# Patient Record
Sex: Female | Born: 1970
Health system: Southern US, Community
[De-identification: ages and names within clinical notes are randomized; demographics above are authoritative.]

---

## 2000-08-22 ENCOUNTER — Other Ambulatory Visit: Admission: RE | Admit: 2000-08-22 | Discharge: 2000-08-22 | Payer: Self-pay | Admitting: Obstetrics & Gynecology

## 2000-08-22 ENCOUNTER — Other Ambulatory Visit: Admission: RE | Admit: 2000-08-22 | Discharge: 2000-08-22 | Payer: Self-pay | Admitting: Family Medicine

## 2001-02-12 ENCOUNTER — Inpatient Hospital Stay (HOSPITAL_COMMUNITY): Admission: AD | Admit: 2001-02-12 | Discharge: 2001-02-12 | Payer: Self-pay | Admitting: Obstetrics & Gynecology

## 2001-02-21 ENCOUNTER — Inpatient Hospital Stay (HOSPITAL_COMMUNITY): Admission: AD | Admit: 2001-02-21 | Discharge: 2001-02-24 | Payer: Self-pay | Admitting: Obstetrics and Gynecology

## 2001-03-23 ENCOUNTER — Other Ambulatory Visit: Admission: RE | Admit: 2001-03-23 | Discharge: 2001-03-23 | Payer: Self-pay | Admitting: Obstetrics and Gynecology

## 2002-04-12 ENCOUNTER — Other Ambulatory Visit: Admission: RE | Admit: 2002-04-12 | Discharge: 2002-04-12 | Payer: Self-pay | Admitting: Obstetrics and Gynecology

## 2003-03-07 ENCOUNTER — Other Ambulatory Visit: Admission: RE | Admit: 2003-03-07 | Discharge: 2003-03-07 | Payer: Self-pay | Admitting: Obstetrics and Gynecology

## 2003-09-13 ENCOUNTER — Inpatient Hospital Stay (HOSPITAL_COMMUNITY): Admission: AD | Admit: 2003-09-13 | Discharge: 2003-09-13 | Payer: Self-pay | Admitting: Obstetrics and Gynecology

## 2003-09-23 ENCOUNTER — Inpatient Hospital Stay (HOSPITAL_COMMUNITY): Admission: RE | Admit: 2003-09-23 | Discharge: 2003-09-26 | Payer: Self-pay | Admitting: Obstetrics and Gynecology

## 2003-09-23 ENCOUNTER — Encounter (INDEPENDENT_AMBULATORY_CARE_PROVIDER_SITE_OTHER): Payer: Self-pay | Admitting: Specialist

## 2003-11-05 ENCOUNTER — Other Ambulatory Visit: Admission: RE | Admit: 2003-11-05 | Discharge: 2003-11-05 | Payer: Self-pay | Admitting: Obstetrics and Gynecology

## 2004-11-30 ENCOUNTER — Other Ambulatory Visit: Admission: RE | Admit: 2004-11-30 | Discharge: 2004-11-30 | Payer: Self-pay | Admitting: Obstetrics and Gynecology

## 2007-08-28 ENCOUNTER — Encounter: Admission: RE | Admit: 2007-08-28 | Discharge: 2007-08-28 | Payer: Self-pay | Admitting: Gastroenterology

## 2010-05-28 NOTE — H&P (Signed)
[facility]  Patient:    Andrea Franklin, Andrea Franklin Visit Number: [id number] MRN: [id number]          Service Type: Attending:  Duke Salvia. Marcelle Overlie, M.D. Dictated by:   Duke Salvia. Marcelle Overlie, M.D.                           History and Physical  CHIEF COMPLAINT:              Labor at term, breech presentation.  HISTORY OF PRESENT ILLNESS:   A 40 year old G2, P0-0-1-0, EDD is March 05, 2001.  She was scheduled for a primary cesarean section next week for persistent breech presentation.  She presents this evening in labor.  She has an uneventful pregnancy with a one-hour GTT of 99.  Blood type is AB negative.  Rubella titer was equivocal.  Group B strep culture was negative.  PAST MEDICAL HISTORY: ALLERGIES:                    None.  OPERATIONS:                   None.  OB/GYN HISTORY:               Spontaneous abortion in 2002.  PHYSICAL EXAMINATION:  VITAL SIGNS:                  Temperature 98.2, blood pressure 100/72.  HEENT:                        Unremarkable.  NECK:                         Supple without mass.  LUNGS:                        Clear.  CARDIOVASCULAR:               Regular rate and rhythm without murmurs, rubs, gallops.  BREASTS:                      Not examined.  ABDOMEN:                      Term fundal height.  Breech by Thayer Ohm.  Fetal heart rate 140.  PELVIC EXAM:                  The cervix was 1+, completely effaced, membranes intact.  Breech presentation.  EXTREMITIES/NEUROLOGICAL:     Exam unremarkable.  DIAGNOSTIC DATA:              Breech presentation verified by ultrasound.  IMPRESSION:                   Term intrauterine pregnancy, breech                               presentation, early labor.  PLAN:                         Primary cesarean section.  Procedure including risks of bleeding, infection, adjacent organ injury reviewed with her which she understands and accepts. Dictated by:   Duke Salvia. Marcelle Overlie,  M.D. Attending:  Duke Salvia. Marcelle Overlie, M.D. DD:  02/21/01 TD:  02/21/01 Job: [id number]

## 2010-05-28 NOTE — Discharge Summary (Signed)
NAME:  Andrea Franklin, Andrea Franklin             ACCOUNT NO.:  [id number]   MEDICAL RECORD NO.:  [id number]          PATIENT TYPE:  INP   LOCATION:  9144                          FACILITY:  [facility]   PHYSICIAN:  Michelle L. Grewal, M.D.DATE OF BIRTH:  07/05/1970   DATE OF ADMISSION:  09/23/2003  DATE OF DISCHARGE:  09/26/2003                                 DISCHARGE SUMMARY   ADMISSION DIAGNOSES:  1.  Intrauterine pregnancy at term.  2.  Previous cesarean section, refuses vaginal birth after cesarean section.  3.  Multiparity, desires permanent sterilization.   DISCHARGE DIAGNOSES:  1.  Status post low transverse cesarean section.  2.  Viable female infant.  3.  Permanent sterilization.   PROCEDURE:  1.  Repeat low transverse cesarean section.  2.  Bilateral tubal ligation.   REASON FOR ADMISSION:  Please see written H&P.   HOSPITAL COURSE:  The patient is a 40 year old gravida 3, para 1, that  presented to [facility] at term for a scheduled cesarean delivery.  The patient had a previous cesarean section and due to multiparity, she also  desired permanent sterilization.  On the morning of admission, the patient  was taken to the operating room where spinal anesthesia was administered  without difficulty.  A low transverse incision was made with the delivery of  a viable female infant weighing 7 pounds 10 ounces with Apgars of 9 at one  minute and 9 at five minutes.  Bilateral tubal ligation was performed  without difficulty.  The patient tolerated the procedure well and was taken  to the recovery room in stable condition.  On postoperative day #1, the  patient had a syncopal episode during the night, however, the following  morning vital signs were stable, and she was afebrile.  Blood pressure 84 to  106 over 40 to 69.  Abdomen was soft with good return of bowel function.  Fundus was firm and nontender.  Abdominal dressing was noted to have a small  amount of drainage noted on the bandage.   Lungs were clear to auscultation  and labs revealed hemoglobin of 12.3 which on admission was 12.8.  On  postoperative day #2, the patient was without complaint other than some  complaints of hemorrhoids.  Vital signs were stable.  She was afebrile.  Abdomen was soft.  Fundus was firm and nontender.  Abdominal dressing had  been removed revealing incision that was clean, dry, and intact.  ProctoFoam  was administered.  On postoperative day #3, vital signs were stable, she was  afebrile, fundus was firm and nontender.  Incision was clean, dry, and  intact.  Staples were removed and the patient was discharged home.   CONDITION ON DISCHARGE:  Good.   DIET:  As tolerated.   ACTIVITY:  No heavy lifting, no driving x2 weeks, and no vaginal entry.   FOLLOW UP:  The patient is to follow up in the office in one to two weeks  for an incision check.  She is to call for temperature greater than 100  degrees, persistent nausea and vomiting, heavy vaginal bleeding, and/or  redness  or drainage from the incision site.   DISCHARGE MEDICATIONS:  1.  Tylox #30 one p.o. every four to six hours p.r.n.  2.  Motrin 600 mg every six hours.  3.  ProctoFoam HC b.i.d.  4.  Colace one p.o. daily p.r.n.     Caro   CC/MEDQ  D:  10/24/2003  T:  10/25/2003  Job:  [id number]

## 2010-05-28 NOTE — Op Note (Signed)
[facility]  Patient:    Andrea Franklin, Andrea Franklin Visit Number: [id number] MRN: [id number]          Service Type: OBS Location: 910A 9114 [id number] Attending Physician:  Rhina Brackett Dictated by:   Duke Salvia. Marcelle Overlie, M.D. Proc. Date: 02/21/01 Admit Date:  02/21/2001                             Operative Report  PREOPERATIVE DIAGNOSIS:       Breech presentation at term, labor.  POSTOPERATIVE DIAGNOSIS:      Breech presentation at term, labor.  OPERATION:                    Primary low transverse cesarean section.  SURGEON:                      Duke Salvia. Marcelle Overlie, M.D.  ANESTHESIA:                   Spinal.  COMPLICATIONS:                None.  DRAINS:                       Foley catheter.  ESTIMATED BLOOD LOSS:         800.  DESCRIPTION OF PROCEDURE AND FINDINGS:                     The patient was taken to the operating room. After an adequate level of spinal anesthetic was obtained with the patient in the left tilt position, the abdomen was prepped and draped in the usual manner for sterile abdominal procedures.  A Foley catheter was positioned draining clear urine.  A transverse incision was made two fingerbreadths above the symphysis and carried down to the fascia which was incised and extended transversely.  The rectus muscles were divided in the midline and the peritoneum entered superiorly without incident and extended in a vertical manner.  The vesicouterine ________ was then incised, and the bladder was bluntly and sharply dissected off of the lower uterine segment.  A bladder blade was positioned.  A transverse incision was made in the lower segment and extended with bandage scissors.  Clear fluid then noted and the patient then delivered of a 7 pound and 14 ounce female.  Apgars 7 and 9 from the frank breech presentation without difficulty.  The infant was suctioned, cord clamped, and passed to the pediatric team for further care.   Placenta delivered manually intact.  The uterus exteriorized and the cavity wiped with the laparotomy pack.  Closure obtained with a first layer of 0 chromic in a locked fashion followed by an imbricating layer of 0 chromic.  With this hemostatic, the bladder flap area was inspected and noted to be intact and hemostatic.  The tubes and ovaries were normal.  Prior to closure, sponge, needle, and instrument counts were reported as correct x2.  The rectus muscles were reapproximated with 2-0 Dexon interrupted sutures.  The fascia was closed from laterally to midline inside with a 0 Dexon running suture.  Subcutaneous fat was hemostatic.  Clips and Steri-Strips were used on the skin, and she did receive Ancef 1 g IV after the cord was clamped along with Pitocin IV.  Clear urine was noted at the end of the case.  She went to  the recovery room in good condition. Dictated by:   Duke Salvia. Marcelle Overlie, M.D. Attending Physician:  Rhina Brackett DD:  02/21/01 TD:  02/22/01 Job: [id number]

## 2010-05-28 NOTE — Discharge Summary (Signed)
[facility]  Patient:    Andrea Franklin, Andrea Franklin Visit Number: [id number] MRN: [id number]          Service Type: OBS Location: 910A 9114 01 Attending Physician:  Rhina Brackett Dictated by:   Danie Chandler, R.N. Admit Date:  02/21/2001 Discharge Date: 02/24/2001                             Discharge Summary  ADMISSION DIAGNOSIS:  Intrauterine pregnancy at term with breech presentation in early labor.  DISCHARGE DIAGNOSIS:  Intrauterine pregnancy at term with breech presentation in early labor.  PROCEDURE:  On February 21, 2001, primary low transverse cesarean section.  REASON FOR ADMISSION:  Please see dictated H&P.  HOSPITAL COURSE:  The patient was taken to the operating room and underwent the above named procedure without complication.  This was productive of a viable female infant with Apgars of 7 at one minute and 9 at five minutes. Postoperatively, on day #1, the patient was without complaint.  Her hemoglobin was 12.2, hematocrit 34.7, and white blood cell count 13.4.  On postoperative day #2, the patient had a good return of bowel function, was tolerating a regular diet.  She was also ambulating well without difficulty, and had good pain control.  She was discharged home on postoperative day #3.  CONDITION ON DISCHARGE:  Good.  DIET:  Regular as tolerated.  ACTIVITY:  No heavy lifting, no driving, no vaginal entry.  FOLLOWUP:  She is to follow up in the office in 1 to 2 weeks for incision check.  She is to call for temperature greater than 100 degrees, persistent nausea or vomiting, heavy vaginal bleeding, and/or redness or drainage from the incision site.  DISCHARGE MEDICATIONS: 1. Prenatal vitamin one p.o. q.d. 2. Motrin 600 mg one p.o. q.6h. p.r.n. pain. 3. Tylox one or two p.o. q.4-6h. p.r.n. pain.Dictated by:   Danie Chandler, R.N. Attending Physician:  Rhina Brackett DD:  03/09/01 TD:  03/09/01 Job:  [id number]

## 2010-05-28 NOTE — Op Note (Signed)
NAME:  Andrea Franklin, Andrea Franklin                       ACCOUNT NO.:  [id number]   MEDICAL RECORD NO.:  [id number]                   PATIENT TYPE:  INP   LOCATION:  NA                                   FACILITY:  [facility]   PHYSICIAN:  Michelle L. Vincente Poli, M.D.            DATE OF BIRTH:  Dec 30, 1970   DATE OF PROCEDURE:  09/23/2003  DATE OF DISCHARGE:                                 OPERATIVE REPORT   PREOPERATIVE DIAGNOSES:  1.  Intrauterine pregnancy at term.  2.  Previous cesarean section, refuses vaginal birth after cesarean      delivery.  3.  Desires permanent sterilization.   POSTOPERATIVE DIAGNOSES:  1.  Intrauterine pregnancy at term.  2.  Previous cesarean section, refuses vaginal birth after cesarean      delivery.  3.  Desires permanent sterilization.   PROCEDURES:  1.  Repeat low transverse cesarean section.  2.  Bilateral tubal ligation.   SURGEON:  Michelle L. Vincente Poli, M.D.   ANESTHESIA:  Spinal.   FINDINGS:  A female infant, Apgars 9 at one minute and 9 at five minutes,  weighing 7 pounds 10 ounces.   PROCEDURE:  The patient was taken to the operating room.  She was then given  her spinal without incident.  She was then prepped and draped in the usual  sterile fashion and a Foley catheter was inserted into the bladder.  A low  transverse incision was made at the area of the previous cesarean section  scar, carried down to the fascia, the fascia scored in the midline and  extended laterally.  The rectus muscles were separated in the midline and  then the peritoneum was entered bluntly.  The peritoneal incision was then  stretched.  The bladder blade was inserted, the lower uterine segment was  identified, and the bladder flap was created sharply and then digitally and  the bladder blade was then readjusted.  A low transverse incision was made  in the uterus.  The amniotic fluid was clear.  The baby was delivered very  easily.  It was a female infant in cephalic presentation  with Apgars 9 at one  minute and 9 at five minutes, weighing 7 pounds 10 ounces.  The cord was  clamped and cut.  The baby was handed to the waiting pediatrician.  The cord  blood was obtained.  The placenta was manually removed, noted to be normal  with three-vessel cord and intact and was set aside.  The uterus was  exteriorized and cleared of all clots and debris.  The uterine incision was  closed in a single layer using 0 chromic in a continuous running locked  stitch.  It was hemostatic.  We then turned our attention to the tubes,  where using a Babcock the midportion of each tube was grasped with a Babcock  clamp and a 3 cm knuckle was tied off on each side using plain gut suture  x2  and each tied-off segment was then excised using Metzenbaum scissors.  This  is the modified Pomeroy method.  This was done bilaterally.  Each tubal  segment was sent in separate specimen containers.  We then noted hemostasis.  The uterus was easily returned to the abdomen.  All surgical sites were  inspected.  Hemostasis was noted.  Irrigation was performed.  The peritoneum  was closed using 0 Vicryl in a continuous running stitch, and the rectus  muscles were reapproximated  using the same 0 Vicryl.  The fascia was closed using 0 Vicryl in a  continuous running stitch, starting at each corner and meeting in the  midline.  After irrigation the skin was closed with staples.  All sponge,  lap, and instrument counts were correct x2.  The patient went to the  recovery room in stable condition.                                               Michelle L. Vincente Poli, M.D.    Florestine Avers  D:  09/23/2003  T:  09/23/2003  Job:  [id number]

## 2011-09-13 ENCOUNTER — Other Ambulatory Visit: Payer: Self-pay | Admitting: Obstetrics and Gynecology

## 2012-06-07 DIAGNOSIS — D239 Other benign neoplasm of skin, unspecified: Secondary | ICD-10-CM

## 2012-06-07 HISTORY — DX: Other benign neoplasm of skin, unspecified: D23.9

## 2012-09-04 ENCOUNTER — Other Ambulatory Visit: Payer: Self-pay | Admitting: Obstetrics and Gynecology

## 2013-09-04 ENCOUNTER — Other Ambulatory Visit: Payer: Self-pay | Admitting: Obstetrics and Gynecology

## 2013-09-05 LAB — CYTOLOGY - PAP

## 2014-09-26 ENCOUNTER — Other Ambulatory Visit: Payer: Self-pay | Admitting: Obstetrics and Gynecology

## 2014-09-29 LAB — CYTOLOGY - PAP

## 2016-06-07 DIAGNOSIS — J029 Acute pharyngitis, unspecified: Secondary | ICD-10-CM | POA: Diagnosis not present

## 2016-08-10 DIAGNOSIS — D225 Melanocytic nevi of trunk: Secondary | ICD-10-CM | POA: Diagnosis not present

## 2016-08-10 DIAGNOSIS — D229 Melanocytic nevi, unspecified: Secondary | ICD-10-CM | POA: Diagnosis not present

## 2016-08-10 DIAGNOSIS — Z1283 Encounter for screening for malignant neoplasm of skin: Secondary | ICD-10-CM | POA: Diagnosis not present

## 2016-08-10 DIAGNOSIS — L578 Other skin changes due to chronic exposure to nonionizing radiation: Secondary | ICD-10-CM | POA: Diagnosis not present

## 2016-10-13 DIAGNOSIS — H1045 Other chronic allergic conjunctivitis: Secondary | ICD-10-CM | POA: Diagnosis not present

## 2016-11-15 DIAGNOSIS — G629 Polyneuropathy, unspecified: Secondary | ICD-10-CM | POA: Diagnosis not present

## 2016-12-06 DIAGNOSIS — Z800 Family history of malignant neoplasm of digestive organs: Secondary | ICD-10-CM | POA: Diagnosis not present

## 2016-12-06 DIAGNOSIS — Z803 Family history of malignant neoplasm of breast: Secondary | ICD-10-CM | POA: Diagnosis not present

## 2016-12-06 DIAGNOSIS — Z01419 Encounter for gynecological examination (general) (routine) without abnormal findings: Secondary | ICD-10-CM | POA: Diagnosis not present

## 2016-12-07 DIAGNOSIS — Z13220 Encounter for screening for lipoid disorders: Secondary | ICD-10-CM | POA: Diagnosis not present

## 2016-12-07 DIAGNOSIS — Z0000 Encounter for general adult medical examination without abnormal findings: Secondary | ICD-10-CM | POA: Diagnosis not present

## 2016-12-08 ENCOUNTER — Other Ambulatory Visit: Payer: Self-pay | Admitting: Family Medicine

## 2016-12-08 DIAGNOSIS — M5430 Sciatica, unspecified side: Secondary | ICD-10-CM

## 2016-12-17 ENCOUNTER — Ambulatory Visit
Admission: RE | Admit: 2016-12-17 | Discharge: 2016-12-17 | Disposition: A | Discharge status: Home / Self Care | Payer: 59 | Source: Ambulatory Visit | Attending: Family Medicine | Admitting: Family Medicine

## 2016-12-17 DIAGNOSIS — M48061 Spinal stenosis, lumbar region without neurogenic claudication: Secondary | ICD-10-CM | POA: Diagnosis not present

## 2016-12-17 DIAGNOSIS — M5430 Sciatica, unspecified side: Secondary | ICD-10-CM

## 2016-12-22 DIAGNOSIS — M5126 Other intervertebral disc displacement, lumbar region: Secondary | ICD-10-CM | POA: Diagnosis not present

## 2017-01-18 DIAGNOSIS — M5126 Other intervertebral disc displacement, lumbar region: Secondary | ICD-10-CM | POA: Diagnosis not present

## 2017-01-23 DIAGNOSIS — Z809 Family history of malignant neoplasm, unspecified: Secondary | ICD-10-CM | POA: Diagnosis not present

## 2017-01-23 DIAGNOSIS — M5126 Other intervertebral disc displacement, lumbar region: Secondary | ICD-10-CM | POA: Diagnosis not present

## 2017-01-25 ENCOUNTER — Other Ambulatory Visit: Payer: Self-pay | Admitting: Obstetrics and Gynecology

## 2017-01-25 DIAGNOSIS — Z803 Family history of malignant neoplasm of breast: Secondary | ICD-10-CM

## 2017-01-26 DIAGNOSIS — M5126 Other intervertebral disc displacement, lumbar region: Secondary | ICD-10-CM | POA: Diagnosis not present

## 2017-02-02 DIAGNOSIS — M5126 Other intervertebral disc displacement, lumbar region: Secondary | ICD-10-CM | POA: Diagnosis not present

## 2017-02-06 ENCOUNTER — Ambulatory Visit
Admission: RE | Admit: 2017-02-06 | Discharge: 2017-02-06 | Disposition: A | Discharge status: Home / Self Care | Payer: 59 | Source: Ambulatory Visit | Attending: Obstetrics and Gynecology | Admitting: Obstetrics and Gynecology

## 2017-02-06 DIAGNOSIS — Z803 Family history of malignant neoplasm of breast: Secondary | ICD-10-CM

## 2017-02-06 MED ORDER — GADOBENATE DIMEGLUMINE 529 MG/ML IV SOLN
17.0000 mL | Freq: Once | INTRAVENOUS | Status: AC | PRN
  Administered 2017-02-06: 17 mL via INTRAVENOUS

## 2017-02-08 DIAGNOSIS — E559 Vitamin D deficiency, unspecified: Secondary | ICD-10-CM | POA: Diagnosis not present

## 2017-02-09 ENCOUNTER — Other Ambulatory Visit: Payer: Self-pay | Admitting: Obstetrics and Gynecology

## 2017-02-09 DIAGNOSIS — R9389 Abnormal findings on diagnostic imaging of other specified body structures: Secondary | ICD-10-CM

## 2017-02-10 HISTORY — PX: BREAST BIOPSY: SHX20

## 2017-02-20 ENCOUNTER — Ambulatory Visit
Admission: RE | Admit: 2017-02-20 | Discharge: 2017-02-20 | Disposition: A | Discharge status: Home / Self Care | Payer: 59 | Source: Ambulatory Visit | Attending: Obstetrics and Gynecology | Admitting: Obstetrics and Gynecology

## 2017-02-20 DIAGNOSIS — R9389 Abnormal findings on diagnostic imaging of other specified body structures: Secondary | ICD-10-CM

## 2017-02-20 DIAGNOSIS — N6012 Diffuse cystic mastopathy of left breast: Secondary | ICD-10-CM | POA: Diagnosis not present

## 2017-02-20 DIAGNOSIS — N6489 Other specified disorders of breast: Secondary | ICD-10-CM | POA: Diagnosis not present

## 2017-02-20 MED ORDER — GADOBENATE DIMEGLUMINE 529 MG/ML IV SOLN
17.0000 mL | Freq: Once | INTRAVENOUS | Status: AC | PRN
  Administered 2017-02-20: 17 mL via INTRAVENOUS

## 2017-10-13 DIAGNOSIS — H11153 Pinguecula, bilateral: Secondary | ICD-10-CM | POA: Diagnosis not present

## 2018-01-05 DIAGNOSIS — Z13220 Encounter for screening for lipoid disorders: Secondary | ICD-10-CM | POA: Diagnosis not present

## 2018-01-05 DIAGNOSIS — G43909 Migraine, unspecified, not intractable, without status migrainosus: Secondary | ICD-10-CM | POA: Diagnosis not present

## 2018-01-05 DIAGNOSIS — E669 Obesity, unspecified: Secondary | ICD-10-CM | POA: Diagnosis not present

## 2018-01-05 DIAGNOSIS — Z0000 Encounter for general adult medical examination without abnormal findings: Secondary | ICD-10-CM | POA: Diagnosis not present

## 2018-01-05 DIAGNOSIS — R350 Frequency of micturition: Secondary | ICD-10-CM | POA: Diagnosis not present

## 2019-07-24 ENCOUNTER — Encounter: Payer: Self-pay | Admitting: Dermatology

## 2019-07-24 ENCOUNTER — Ambulatory Visit: Payer: BC Managed Care – PPO | Admitting: Dermatology

## 2019-07-24 ENCOUNTER — Other Ambulatory Visit: Payer: Self-pay

## 2019-07-24 DIAGNOSIS — D229 Melanocytic nevi, unspecified: Secondary | ICD-10-CM

## 2019-07-24 DIAGNOSIS — L719 Rosacea, unspecified: Secondary | ICD-10-CM | POA: Diagnosis not present

## 2019-07-24 DIAGNOSIS — L578 Other skin changes due to chronic exposure to nonionizing radiation: Secondary | ICD-10-CM

## 2019-07-24 DIAGNOSIS — Z1283 Encounter for screening for malignant neoplasm of skin: Secondary | ICD-10-CM | POA: Diagnosis not present

## 2019-07-24 DIAGNOSIS — L821 Other seborrheic keratosis: Secondary | ICD-10-CM

## 2019-07-24 DIAGNOSIS — L858 Other specified epidermal thickening: Secondary | ICD-10-CM

## 2019-07-24 DIAGNOSIS — D1800 Hemangioma unspecified site: Secondary | ICD-10-CM

## 2019-07-24 DIAGNOSIS — Z86018 Personal history of other benign neoplasm: Secondary | ICD-10-CM

## 2019-07-24 DIAGNOSIS — L818 Other specified disorders of pigmentation: Secondary | ICD-10-CM | POA: Diagnosis not present

## 2019-07-24 DIAGNOSIS — D2261 Melanocytic nevi of right upper limb, including shoulder: Secondary | ICD-10-CM

## 2019-07-24 DIAGNOSIS — L814 Other melanin hyperpigmentation: Secondary | ICD-10-CM

## 2019-07-24 NOTE — Progress Notes (Signed)
   Follow-Up Visit   Subjective  Andrea Franklin is a 49 y.o. female who presents for the following: Annual Exam (History of dysplastic nevi - TBSE today.). The patient presents for Total-Body Skin Exam (TBSE) for skin cancer screening and mole check.  The following portions of the chart were reviewed this encounter and updated as appropriate:  Allergies  Meds  Problems  Med Hx  Surg Hx  Fam Hx     Review of Systems:  No other skin or systemic complaints except as noted in HPI or Assessment and Plan.  Objective  Well appearing patient in no apparent distress; mood and affect are within normal limits.  A full examination was performed including scalp, head, eyes, ears, nose, lips, neck, chest, axillae, abdomen, back, buttocks, bilateral upper extremities, bilateral lower extremities, hands, feet, fingers, toes, fingernails, and toenails. All findings within normal limits unless otherwise noted below.  Objective  Head - Anterior (Face): Erythema and dilated blood vessels.  Objective  Bilateral lower legs: Hypopigmented macules.  Objective  Bilateral tricep areas, bilateral cheeks: Rough follicular plugging.  Objective  Right deltoid: Speckled linear brown patch.   Assessment & Plan    Lentigines - Scattered tan macules - Discussed due to sun exposure - Benign, observe - Call for any changes  Seborrheic Keratoses - Stuck-on, waxy, tan-brown papules and plaques  - Discussed benign etiology and prognosis. - Observe - Call for any changes  Melanocytic Nevi - Tan-brown and/or pink-flesh-colored symmetric macules and papules - Benign appearing on exam today - Observation - Call clinic for new or changing moles - Recommend daily use of broad spectrum spf 30+ sunscreen to sun-exposed areas.   Hemangiomas - Red papules - Discussed benign nature - Observe - Call for any changes  Actinic Damage - diffuse scaly erythematous macules with underlying  dyspigmentation - Recommend daily broad spectrum sunscreen SPF 30+ to sun-exposed areas, reapply every 2 hours as needed.  - Call for new or changing lesions.  Skin cancer screening performed today.  History of Dysplastic Nevi - No evidence of recurrence today - Recommend regular full body skin exams - Recommend daily broad spectrum sunscreen SPF 30+ to sun-exposed areas, reapply every 2 hours as needed.  - Call if any new or changing lesions are noted between office visits  Rosacea Head - Anterior (Face)  Mild. No treatment today.  Idiopathic guttate hypomelanosis Bilateral lower legs  Benign.  Keratosis pilaris Bilateral tricep areas, bilateral cheeks Amalactin Rapid Relief lotion.  Consider retinoids in future. Genetic and treatable but not curable. Benign.  Nevus Right deltoid  Linear Epidermal Nevus - Benign appearing. Observe   Skin cancer screening  Return in about 1 year (around 07/23/2020) for TBSE.   I, Ashok Cordia, CMA, am acting as scribe for Sarina Ser, MD .  Documentation: I have reviewed the above documentation for accuracy and completeness, and I agree with the above.  Sarina Ser, MD

## 2019-07-28 ENCOUNTER — Encounter: Payer: Self-pay | Admitting: Dermatology

## 2019-07-29 ENCOUNTER — Encounter: Payer: Self-pay | Admitting: Dermatology

## 2020-04-13 ENCOUNTER — Other Ambulatory Visit: Payer: Self-pay | Admitting: Obstetrics and Gynecology

## 2020-04-13 DIAGNOSIS — R928 Other abnormal and inconclusive findings on diagnostic imaging of breast: Secondary | ICD-10-CM

## 2020-05-05 ENCOUNTER — Other Ambulatory Visit: Payer: BC Managed Care – PPO

## 2020-05-06 ENCOUNTER — Ambulatory Visit
Admission: RE | Admit: 2020-05-06 | Discharge: 2020-05-06 | Disposition: A | Discharge status: Home / Self Care | Payer: BC Managed Care – PPO | Source: Ambulatory Visit | Attending: Obstetrics and Gynecology | Admitting: Obstetrics and Gynecology

## 2020-05-06 ENCOUNTER — Other Ambulatory Visit: Payer: Self-pay | Admitting: Obstetrics and Gynecology

## 2020-05-06 ENCOUNTER — Other Ambulatory Visit: Payer: Self-pay

## 2020-05-06 DIAGNOSIS — R928 Other abnormal and inconclusive findings on diagnostic imaging of breast: Secondary | ICD-10-CM

## 2020-05-12 ENCOUNTER — Ambulatory Visit
Admission: RE | Admit: 2020-05-12 | Discharge: 2020-05-12 | Disposition: A | Discharge status: Home / Self Care | Payer: BC Managed Care – PPO | Source: Ambulatory Visit | Attending: Obstetrics and Gynecology | Admitting: Obstetrics and Gynecology

## 2020-05-12 ENCOUNTER — Other Ambulatory Visit: Payer: Self-pay

## 2020-05-12 DIAGNOSIS — R928 Other abnormal and inconclusive findings on diagnostic imaging of breast: Secondary | ICD-10-CM

## 2020-05-12 HISTORY — PX: BREAST BIOPSY: SHX20

## 2020-07-30 ENCOUNTER — Encounter: Payer: BC Managed Care – PPO | Admitting: Dermatology

## 2020-10-08 ENCOUNTER — Other Ambulatory Visit: Payer: Self-pay | Admitting: Obstetrics and Gynecology

## 2020-10-08 DIAGNOSIS — N6012 Diffuse cystic mastopathy of left breast: Secondary | ICD-10-CM

## 2020-11-12 ENCOUNTER — Other Ambulatory Visit: Payer: Self-pay | Admitting: Obstetrics and Gynecology

## 2020-11-12 ENCOUNTER — Other Ambulatory Visit: Payer: Self-pay

## 2020-11-12 ENCOUNTER — Ambulatory Visit
Admission: RE | Admit: 2020-11-12 | Discharge: 2020-11-12 | Disposition: A | Discharge status: Home / Self Care | Payer: BC Managed Care – PPO | Source: Ambulatory Visit | Attending: Obstetrics and Gynecology | Admitting: Obstetrics and Gynecology

## 2020-11-12 DIAGNOSIS — N6012 Diffuse cystic mastopathy of left breast: Secondary | ICD-10-CM

## 2021-05-10 ENCOUNTER — Ambulatory Visit: Payer: BC Managed Care – PPO | Admitting: Dermatology

## 2021-05-10 ENCOUNTER — Encounter: Payer: Self-pay | Admitting: Dermatology

## 2021-05-10 DIAGNOSIS — Z1283 Encounter for screening for malignant neoplasm of skin: Secondary | ICD-10-CM

## 2021-05-10 DIAGNOSIS — D235 Other benign neoplasm of skin of trunk: Secondary | ICD-10-CM

## 2021-05-10 DIAGNOSIS — D2261 Melanocytic nevi of right upper limb, including shoulder: Secondary | ICD-10-CM

## 2021-05-10 DIAGNOSIS — D229 Melanocytic nevi, unspecified: Secondary | ICD-10-CM

## 2021-05-10 DIAGNOSIS — Z86018 Personal history of other benign neoplasm: Secondary | ICD-10-CM

## 2021-05-10 DIAGNOSIS — D239 Other benign neoplasm of skin, unspecified: Secondary | ICD-10-CM

## 2021-05-10 DIAGNOSIS — L814 Other melanin hyperpigmentation: Secondary | ICD-10-CM

## 2021-05-10 DIAGNOSIS — L821 Other seborrheic keratosis: Secondary | ICD-10-CM

## 2021-05-10 DIAGNOSIS — D1800 Hemangioma unspecified site: Secondary | ICD-10-CM

## 2021-05-10 DIAGNOSIS — L578 Other skin changes due to chronic exposure to nonionizing radiation: Secondary | ICD-10-CM | POA: Diagnosis not present

## 2021-05-10 DIAGNOSIS — L858 Other specified epidermal thickening: Secondary | ICD-10-CM

## 2021-05-10 NOTE — Progress Notes (Signed)
? ?Follow-Up Visit ?  ?Subjective  ?Andrea Franklin is a 51 y.o. female who presents for the following: Annual Exam (Skin cancer screening. Full body. Hx of dysplastic nevi. Areas of concern today). ?The patient presents for Total-Body Skin Exam (TBSE) for skin cancer screening and mole check.  The patient has spots, moles and lesions to be evaluated, some may be new or changing and the patient has concerns that these could be cancer. ? ?The following portions of the chart were reviewed this encounter and updated as appropriate:  Allergies  Meds  Problems  Med Hx  Surg Hx  Fam Hx   ?  ?Review of Systems: No other skin or systemic complaints except as noted in HPI or Assessment and Plan. ? ?Objective  ?Well appearing patient in no apparent distress; mood and affect are within normal limits. ? ?A full examination was performed including scalp, head, eyes, ears, nose, lips, neck, chest, axillae, abdomen, back, buttocks, bilateral upper extremities, bilateral lower extremities, hands, feet, fingers, toes, fingernails, and toenails. All findings within normal limits unless otherwise noted below. ? ?Right Upper Arm - Anterior ?Brown linear lesion ? ? ?Assessment & Plan  ? ?History of Dysplastic Nevi. Right breast 2014, left post flank 2017, RUQ abdomen 2018. ?- No evidence of recurrence today ?- Recommend regular full body skin exams ?- Recommend daily broad spectrum sunscreen SPF 30+ to sun-exposed areas, reapply every 2 hours as needed.  ?- Call if any new or changing lesions are noted between office visits  ? ?Lentigines ?- Scattered tan macules ?- Due to sun exposure ?- Benign-appearing, observe ?- Recommend daily broad spectrum sunscreen SPF 30+ to sun-exposed areas, reapply every 2 hours as needed. ?- Call for any changes ? ?Seborrheic Keratoses ?- Stuck-on, waxy, tan-brown papules and/or plaques  ?- Benign-appearing ?- Discussed benign etiology and prognosis. ?- Observe ?- Call for any  changes ? ?Melanocytic Nevi ?- Tan-brown and/or pink-flesh-colored symmetric macules and papules ?- Benign appearing on exam today ?- Observation ?- Call clinic for new or changing moles ?- Recommend daily use of broad spectrum spf 30+ sunscreen to sun-exposed areas.  ? ?Hemangiomas ?- Red papules ?- Discussed benign nature ?- Observe ?- Call for any changes ? ?Actinic Damage ?- Chronic condition, secondary to cumulative UV/sun exposure ?- diffuse scaly erythematous macules with underlying dyspigmentation ?- Recommend daily broad spectrum sunscreen SPF 30+ to sun-exposed areas, reapply every 2 hours as needed.  ?- Staying in the shade or wearing long sleeves, sun glasses (UVA+UVB protection) and wide brim hats (4-inch brim around the entire circumference of the hat) are also recommended for sun protection.  ?- Call for new or changing lesions. ? ?Skin cancer screening performed today. ? ?Keratosis Pilaris ?- Tiny follicular keratotic papules ?- Benign. Genetic in nature. No cure. ?- Observe. ?- If desired, patient can use an emollient (moisturizer) containing ammonium lactate, urea or salicylic acid once a day to smooth the area ? ?Dermatofibroma. Right buttock. ?- Firm pink/brown papulenodule with dimple sign ?- Benign appearing ?- Call for any changes ? ?Linear epidermal nevus ?Right Upper Arm - Anterior ?Benign-appearing.  Observation.  Call clinic for new or changing lesions.  Recommend daily use of broad spectrum spf 30+ sunscreen to sun-exposed areas.  ? ?Skin cancer screening ? ?Return in about 1 year (around 05/11/2022) for TBSE, HxDN. ? ?I, Emelia Salisbury, CMA, am acting as scribe for Sarina Ser, MD. ?Documentation: I have reviewed the above documentation for accuracy and completeness, and I agree  with the above. ? ?Sarina Ser, MD ? ? ?

## 2021-05-10 NOTE — Patient Instructions (Signed)
Recommend daily broad spectrum sunscreen SPF 30+ to sun-exposed areas, reapply every 2 hours as needed. Call for new or changing lesions.  ?Staying in the shade or wearing long sleeves, sun glasses (UVA+UVB protection) and wide brim hats (4-inch brim around the entire circumference of the hat) are also recommended for sun protection.  ? ? ?Melanoma ABCDEs ? ?Melanoma is the most dangerous type of skin cancer, and is the leading cause of death from skin disease.  You are more likely to develop melanoma if you: ?Have light-colored skin, light-colored eyes, or red or blond hair ?Spend a lot of time in the sun ?Tan regularly, either outdoors or in a tanning bed ?Have had blistering sunburns, especially during childhood ?Have a close family member who has had a melanoma ?Have atypical moles or large birthmarks ? ?Early detection of melanoma is key since treatment is typically straightforward and cure rates are extremely high if we catch it early.  ? ?The first sign of melanoma is often a change in a mole or a new dark spot.  The ABCDE system is a way of remembering the signs of melanoma. ? ?A for asymmetry:  The two halves do not match. ?B for border:  The edges of the growth are irregular. ?C for color:  A mixture of colors are present instead of an even brown color. ?D for diameter:  Melanomas are usually (but not always) greater than 35m - the size of a pencil eraser. ?E for evolution:  The spot keeps changing in size, shape, and color. ? ?Please check your skin once per month between visits. You can use a small mirror in front and a large mirror behind you to keep an eye on the back side or your body.  ? ?If you see any new or changing lesions before your next follow-up, please call to schedule a visit. ? ?Please continue daily skin protection including broad spectrum sunscreen SPF 30+ to sun-exposed areas, reapplying every 2 hours as needed when you're outdoors.  ? ?Staying in the shade or wearing long sleeves, sun  glasses (UVA+UVB protection) and wide brim hats (4-inch brim around the entire circumference of the hat) are also recommended for sun protection.   ? ? ?If You Need Anything After Your Visit ? ?If you have any questions or concerns for your doctor, please call our main line at [phone] press option 4 to reach your doctor's medical assistant. If no one answers, please leave a voicemail as directed and we will return your call as soon as possible. Messages left after 4 pm will be answered the following business day.  ? ?You may also send [place] message via MyChart. We typically respond to MyChart messages within 1-2 business days. ? ?For prescription refills, please ask your pharmacy to contact our office. Our fax number is [phone] ? ?If you have an urgent issue when the clinic is closed that cannot wait until the next business day, you can page your doctor at the number below.   ? ?Please note that while we do our best to be available for urgent issues outside of office hours, we are not available 24/7.  ? ?If you have an urgent issue and are unable to reach [place] you may choose to seek medical care at your doctor's office, retail clinic, urgent care center, or emergency room. ? ?If you have a medical emergency, please immediately call 911 or go to the emergency department. ? ?Pager Numbers ? ?- Dr. KNehemiah Massed [phone]? ?-  Dr. Laurence Ferrari: [phone] ? ?- Dr. Nicole Kindred: [phone] ? ?In the event of inclement weather, please call our main line at [phone] for an update on the status of any delays or closures. ? ?Dermatology Medication Tips: ?Please keep the boxes that topical medications come in in order to help keep track of the instructions about where and how to use these. Pharmacies typically print the medication instructions only on the boxes and not directly on the medication tubes.  ? ?If your medication is too expensive, please contact our office at [phone] option 4 or send [place] a message  through [place].  ? ?We are unable to tell what your co-pay for medications will be in advance as this is different depending on your insurance coverage. However, we may be able to find a substitute medication at lower cost or fill out paperwork to get insurance to cover a needed medication.  ? ?If a prior authorization is required to get your medication covered by your insurance company, please allow [place] 1-2 business days to complete this process. ? ?Drug prices often vary depending on where the prescription is filled and some pharmacies may offer cheaper prices. ? ?The website www.goodrx.com contains coupons for medications through different pharmacies. The prices here do not account for what the cost may be with help from insurance (it may be cheaper with your insurance), but the website can give you the price if you did not use any insurance.  ?- You can print the associated coupon and take it with your prescription to the pharmacy.  ?- You may also stop by our office during regular business hours and pick up a GoodRx coupon card.  ?- If you need your prescription sent electronically to a different pharmacy, notify our office through [facility] or by phone at [phone] option 4. ? ? ? ? ?Si Usted Necesita Algo Despu?s de Su Visita ? ?Tambi?n puede enviarnos un mensaje a trav?s de MyChart. Por lo general respondemos a los mensajes de MyChart en el transcurso de 1 a 2 d?as h?biles. ? ?Para renovar recetas, por favor pida a su farmacia que se ponga en contacto con nuestra oficina. Nuestro n?mero de fax es el [phone]. ? ?Si tiene un asunto urgente cuando la cl?nica est? cerrada y que no puede esperar hasta el siguiente d?a h?bil, puede llamar/localizar a su doctor(a) al n?mero que aparece a continuaci?n.  ? ?Por favor, tenga en cuenta que aunque hacemos todo lo posible para estar disponibles para asuntos urgentes fuera del horario de oficina, no estamos disponibles las 24 horas del d?a, los 7 d?as de  la semana.  ? ?Si tiene un problema urgente y no puede comunicarse con nosotros, puede optar por buscar atenci?n m?dica  en el consultorio de su doctor(a), en una cl?nica privada, en un centro de atenci?n urgente o en una sala de emergencias. ? ?Si tiene Engineer, maintenance (IT) m?dica, por favor llame inmediatamente al 911 o vaya a la sala de emergencias. ? ?N?meros de b?per ? ?- Dr. Nehemiah Massed: [phone] ? ?- Dra. Moye: [phone] ? ?- Dra. Nicole Kindred: [phone] ? ?En caso de inclemencias del tiempo, por favor llame a nuestra l?nea principal al [phone] para una actualizaci?n sobre el estado de cualquier retraso o cierre. ? ?Consejos para la medicaci?n en dermatolog?a: ?Por favor, guarde las cajas en las que vienen los medicamentos de uso t?pico para ayudarle a seguir las instrucciones sobre d?nde y c?mo usarlos. Las farmacias generalmente imprimen las instrucciones del medicamento s?lo en las cajas y  no directamente en los tubos del medicamento.  ? ?Si su medicamento es muy caro, por favor, p?ngase en contacto con Zigmund Daniel llamando al [phone] y presione la opci?n 4 o env?enos un mensaje a trav?s de MyChart.  ? ?No podemos decirle cu?l ser? su copago por los medicamentos por adelantado ya que esto es diferente dependiendo de la cobertura de su seguro. Sin embargo, es posible que podamos encontrar un medicamento sustituto a Electrical engineer un formulario para que el seguro cubra el medicamento que se considera necesario.  ? ?Si se requiere Ardelia Mems autorizaci?n previa para que su compa??a de seguros [place] su medicamento, por favor perm?tanos de 1 a 2 d?as h?biles para completar este proceso. ? ?Los precios de los medicamentos var?an con frecuencia dependiendo del Environmental consultant de d?nde se surte la receta y alguna farmacias pueden ofrecer precios m?s baratos. ? ?El sitio web www.goodrx.com tiene cupones para medicamentos de Airline pilot. Los precios aqu? no tienen en cuenta lo que podr?a costar con la ayuda  del seguro (puede ser m?s barato con su seguro), pero el sitio web puede darle el precio si no utiliz? ning?n seguro.  ?- Puede imprimir el cup?n correspondiente y llevarlo con su receta a la farmacia.

## 2021-05-13 ENCOUNTER — Ambulatory Visit
Admission: RE | Admit: 2021-05-13 | Discharge: 2021-05-13 | Disposition: A | Discharge status: Home / Self Care | Payer: BC Managed Care – PPO | Source: Ambulatory Visit | Attending: Obstetrics and Gynecology | Admitting: Obstetrics and Gynecology

## 2021-05-13 DIAGNOSIS — N6012 Diffuse cystic mastopathy of left breast: Secondary | ICD-10-CM

## 2021-05-18 ENCOUNTER — Encounter: Payer: Self-pay | Admitting: Dermatology

## 2021-09-23 ENCOUNTER — Other Ambulatory Visit (HOSPITAL_COMMUNITY): Payer: Self-pay | Admitting: Physician Assistant

## 2021-09-23 DIAGNOSIS — R079 Chest pain, unspecified: Secondary | ICD-10-CM

## 2021-09-29 ENCOUNTER — Telehealth (HOSPITAL_COMMUNITY): Payer: Self-pay | Admitting: *Deleted

## 2021-09-29 NOTE — Telephone Encounter (Signed)
Close encounter 

## 2021-10-01 ENCOUNTER — Ambulatory Visit (HOSPITAL_COMMUNITY)
Admission: RE | Admit: 2021-10-01 | Discharge: 2021-10-01 | Disposition: A | Discharge status: Home / Self Care | Payer: BC Managed Care – PPO | Source: Ambulatory Visit | Attending: Cardiovascular Disease | Admitting: Cardiovascular Disease

## 2021-10-01 DIAGNOSIS — R079 Chest pain, unspecified: Secondary | ICD-10-CM | POA: Diagnosis present

## 2021-10-01 LAB — EXERCISE TOLERANCE TEST
Angina Index: 0
Duke Treadmill Score: 9
Estimated workload: 10.6
Exercise duration (min): 9 min
Exercise duration (sec): 21 s
MPHR: 167 {beats}/min
Peak HR: 164 {beats}/min
Percent HR: 98 %
Rest HR: 86 {beats}/min
ST Depression (mm): 0 mm

## 2022-05-25 ENCOUNTER — Ambulatory Visit: Payer: BC Managed Care – PPO | Admitting: Dermatology

## 2022-10-28 ENCOUNTER — Other Ambulatory Visit: Payer: Self-pay

## 2022-10-28 ENCOUNTER — Encounter (HOSPITAL_BASED_OUTPATIENT_CLINIC_OR_DEPARTMENT_OTHER): Payer: Self-pay | Admitting: Emergency Medicine

## 2022-10-28 DIAGNOSIS — D72829 Elevated white blood cell count, unspecified: Secondary | ICD-10-CM | POA: Insufficient documentation

## 2022-10-28 DIAGNOSIS — R55 Syncope and collapse: Secondary | ICD-10-CM | POA: Diagnosis present

## 2022-10-28 LAB — URINALYSIS, ROUTINE W REFLEX MICROSCOPIC
Bilirubin Urine: NEGATIVE
Glucose, UA: NEGATIVE mg/dL
Ketones, ur: NEGATIVE mg/dL
Nitrite: NEGATIVE
Protein, ur: NEGATIVE mg/dL
Specific Gravity, Urine: 1.014 (ref 1.005–1.030)
pH: 5.5 (ref 5.0–8.0)

## 2022-10-28 LAB — BASIC METABOLIC PANEL
Anion gap: 8 (ref 5–15)
BUN: 18 mg/dL (ref 6–20)
CO2: 27 mmol/L (ref 22–32)
Calcium: 9.4 mg/dL (ref 8.9–10.3)
Chloride: 101 mmol/L (ref 98–111)
Creatinine, Ser: 0.98 mg/dL (ref 0.44–1.00)
GFR, Estimated: >60 mL/min (ref >60)
Glucose, Bld: 115 mg/dL — ABNORMAL HIGH (ref 70–99)
Potassium: 3.9 mmol/L (ref 3.5–5.1)
Sodium: 136 mmol/L (ref 135–145)

## 2022-10-28 LAB — CBC
HCT: 40 % (ref 36.0–46.0)
Hemoglobin: 13.9 g/dL (ref 12.0–15.0)
MCH: 30.9 pg (ref 26.0–34.0)
MCHC: 34.8 g/dL (ref 30.0–36.0)
MCV: 88.9 fL (ref 80.0–100.0)
Platelets: 313 10*3/uL (ref 150–400)
RBC: 4.5 MIL/uL (ref 3.87–5.11)
RDW: 12.2 % (ref 11.5–15.5)
WBC: 12.8 10*3/uL — ABNORMAL HIGH (ref 4.0–10.5)
nRBC: 0 % (ref 0.0–0.2)

## 2022-10-28 LAB — CBG MONITORING, ED: Glucose-Capillary: 119 mg/dL — ABNORMAL HIGH (ref 70–99)

## 2022-10-28 NOTE — ED Triage Notes (Signed)
Pt presents reports eating dinner in [place] and had an episode of "passing out" per family. States now she just "keeps belching and my arms feel heavy"  Husband reports she had LOC about a minute and a half.  Also reports he felt like her mouth turned blue.  No CP or shob.

## 2022-10-29 ENCOUNTER — Emergency Department (HOSPITAL_BASED_OUTPATIENT_CLINIC_OR_DEPARTMENT_OTHER)
Admission: EM | Admit: 2022-10-29 | Discharge: 2022-10-29 | Disposition: A | Discharge status: Home / Self Care | Payer: BC Managed Care – PPO | Attending: Emergency Medicine | Admitting: Emergency Medicine

## 2022-10-29 DIAGNOSIS — R55 Syncope and collapse: Secondary | ICD-10-CM

## 2022-10-29 NOTE — ED Provider Notes (Signed)
[place] EMERGENCY DEPARTMENT AT [facility] Provider Note   CSN: [id number] Arrival date & time: 10/28/22  2238     History  Chief Complaint  Patient presents with   Loss of Consciousness    Andrea Franklin is a 52 y.o. female.  Patient is a 51 year old female with no significant past medical history.  Patient presenting for evaluation of syncope.  She was out to dinner this evening with her husband at an [place].  After she had eaten, she began to feel nauseated, then went outside to get some fresh air.  She sat down on a bench, apparently had a brief loss of consciousness.  Her husband called the ambulance and patient was evaluated by EMS.  She had stable vital signs, then patient was transported here by private auto.  Patient denies abdominal pain, headache, chest pain, or palpitations.  She is now feeling somewhat better.  She reports a similar episode several years ago for which she had a workup performed that was unremarkable.  The history is provided by the patient.       Home Medications Prior to Admission medications   Medication Sig Start Date End Date Taking? Authorizing Provider  buPROPion (WELLBUTRIN XL) 150 MG 24 hr tablet Take 150 mg by mouth every morning. 03/19/19   [provider]  ibuprofen (ADVIL) 800 MG tablet Take 800 mg by mouth every 8 (eight) hours as needed. 02/19/19   [provider]  Misc Natural Products (BLACK COHOSH MENOPAUSE COMPLEX) TABS 1 tablet    [provider]  Red Yeast Rice 600 MG TABS 1 tablet 07/06/20   [provider]      Allergies    Patient has no known allergies.    Review of Systems   Review of Systems  All other systems reviewed and are negative.   Physical Exam Updated Vital Signs BP 118/76 (BP Location: Right Arm)   Pulse 85   Temp 98 F (36.7 C)   Resp 18   SpO2 98%  Physical Exam Vitals and nursing note reviewed.  Constitutional:      General: She is not in  acute distress.    Appearance: She is well-developed. She is not diaphoretic.  HENT:     Head: Normocephalic and atraumatic.  Cardiovascular:     Rate and Rhythm: Normal rate and regular rhythm.     Heart sounds: No murmur heard.    No friction rub. No gallop.  Pulmonary:     Effort: Pulmonary effort is normal. No respiratory distress.     Breath sounds: Normal breath sounds. No wheezing.  Abdominal:     General: Bowel sounds are normal. There is no distension.     Palpations: Abdomen is soft.     Tenderness: There is no abdominal tenderness.  Musculoskeletal:        General: Normal range of motion.     Cervical back: Normal range of motion and neck supple.  Skin:    General: Skin is warm and dry.  Neurological:     General: No focal deficit present.     Mental Status: She is alert and oriented to person, place, and time.     ED Results / Procedures / Treatments   Labs (all labs ordered are listed, but only abnormal results are displayed) Labs Reviewed  BASIC METABOLIC PANEL - Abnormal; Notable for the following components:      Result Value   Glucose, Bld 115 (*)  All other components within normal limits  CBC - Abnormal; Notable for the following components:   WBC 12.8 (*)    All other components within normal limits  URINALYSIS, ROUTINE W REFLEX MICROSCOPIC - Abnormal; Notable for the following components:   Hgb urine dipstick SMALL (*)    Leukocytes,Ua SMALL (*)    Bacteria, UA RARE (*)    All other components within normal limits  CBG MONITORING, ED - Abnormal; Notable for the following components:   Glucose-Capillary 119 (*)    All other components within normal limits  CBG MONITORING, ED    EKG EKG Interpretation Date/Time:  Friday October 28 2022 23:06:50 EDT Ventricular Rate:  82 PR Interval:  172 QRS Duration:  90 QT Interval:  378 QTC Calculation: 441 R Axis:   25  Text Interpretation: Normal sinus rhythm Normal ECG No previous ECGs available  Confirmed by Geoffery Lyons ([id number]) on 10/28/2022 11:38:39 PM  Radiology No results found.  Procedures Procedures    Medications Ordered in ED Medications - No data to display  ED Course/ Medical Decision Making/ A&P  Patient presenting with complaints of nausea and syncope as described in the HPI.  Patient arrives here with stable vital signs and is afebrile.  Physical examination is unremarkable.  Abdominal exam is benign.  Workup initiated including CBC, metabolic panel, and urinalysis, all of which are unremarkable with the exception of a mild leukocytosis of 12.5.  At this point, the cause of the patient's syncope is unclear, but nothing today appears emergent..  It sounds as though she is having some nausea and possibly could be related to a viral etiology with a vasovagal response.  Patient's abdominal exam is benign and she is reporting no abdominal tenderness.  I feel as though patient can safely be discharged.  I highly doubt appendicitis or other emergent intra-abdominal process.  Also doubt a cardiac etiology to her syncope.  Final Clinical Impression(s) / ED Diagnoses Final diagnoses:  None    Rx / DC Orders ED Discharge Orders     None         Geoffery Lyons, MD 10/29/22 0134

## 2022-10-29 NOTE — Discharge Instructions (Signed)
Drink plenty of fluids and get plenty of rest.  Return to the emergency department if you develop any new and/or concerning issues.

## 2022-11-03 ENCOUNTER — Encounter: Payer: Self-pay | Admitting: Neurology

## 2022-11-14 ENCOUNTER — Ambulatory Visit: Payer: BC Managed Care – PPO | Admitting: Neurology

## 2022-11-14 ENCOUNTER — Other Ambulatory Visit: Payer: Self-pay | Admitting: Neurology

## 2022-11-14 ENCOUNTER — Encounter: Payer: Self-pay | Admitting: Neurology

## 2022-11-14 VITALS — Resp 15 | Ht 65.0 in | Wt 184.0 lb

## 2022-11-14 DIAGNOSIS — G40209 Localization-related (focal) (partial) symptomatic epilepsy and epileptic syndromes with complex partial seizures, not intractable, without status epilepticus: Secondary | ICD-10-CM | POA: Diagnosis not present

## 2022-11-14 MED ORDER — LAMOTRIGINE 100 MG PO TABS: 100.0000 mg | ORAL_TABLET | Freq: Two times a day (BID) | ORAL | 3 refills | Status: DC

## 2022-11-14 MED ORDER — ALPRAZOLAM 0.5 MG PO TABS: ORAL_TABLET | ORAL | 0 refills | Status: DC

## 2022-11-14 MED ORDER — LAMOTRIGINE 25 MG PO TABS: ORAL_TABLET | ORAL | 0 refills | Status: DC

## 2022-11-14 NOTE — Telephone Encounter (Signed)
Pharmacy comment: Script Clarification:STARTING DOSE SO HIGH.   ROUTING COMMENT FROM THE PHARMACY

## 2022-11-14 NOTE — Progress Notes (Signed)
Chief Complaint  Patient presents with   New Patient (Initial Visit)    Rm15, alone, Syncope:started  6 yrs ago when on a trip to [place] she got light headed and naused like a hot flash while on a trip to [place], then 1-2 after it happened again while in [place], October 19th happened again while here in [place] at a restaurant in [place]      ASSESSMENT AND PLAN  Andrea Franklin is a 52 y.o. female   Recurrent stereotypical episode, most suggestive of complex partial seizure  MRI of the brain with without contrast  EEG  Lamotrigine 25 mg titrating to 100 mg twice a day, document all event,  No driving until seizure-free for 6 months, documentation for working from home  DIAGNOSTIC DATA (LABS, IMAGING, TESTING) - I reviewed patient records, labs, notes, testing and imaging myself where available.   MEDICAL HISTORY:  Andrea Franklin is a 52 year old female, seen in request by   her primary care doctor Lupita Raider, for evaluation of recurrent passing out episode, initial evaluation November 14, 2022  History is obtained from the patient and review of electronic medical records. I personally reviewed pertinent available imaging films in PACS.   PMHx of   She reports 3 major episode of sudden onset passing out, often with preceding symptoms, other episode have similar semiology.  First episode was in 2019, she was on a business trip in [place], eating dinner, she suddenly felt hot flash, vertigo, when she tried to get up walking, she fell in the restaurant, had urinary incontinence  Second episode was in 2021, she was eating dinner with her coworker, felt similar sensation coming on, hot flash, travel throughout her body, nausea, dizziness, vertigo, coworker described she has gaze to look in her eyes, become unresponsive, grabbed her in the chair preventing her fall, episode lasted for few minutes, also had urinary incontinence,  Most recent episode was on October 29, 2022, she  was eating out with her husband, sitting in a restaurant, had similar sensation again, overwhelming hot flash, dizziness, broke out in cold sweat, her husband helped her moving out of the restaurant, sitting at the outside bench, suddenly she threw her head backwards, become unresponsive, was taken by ambulance to emergency room, denies urinary incontinence tongue biting   Lab showed elevated wbc of 12.8,  normal BMP  She had 2 small episode over past few weeks, describing sudden onset similar hot flash, dizziness, fainting sensation, but without further evolving into loss of consciousness  Looking back, she had similar spells for many years, as far back as high school, thought it was due to her glucose issues, but never had a documented hypoglycemia,   Dr. Teodoro Spray from [facility] in October 2024, normal vitamin D 48, LDL 174, CMP creatinine 0.86, CBC hemoglobin of 13.6,  PHYSICAL EXAM:   Vitals:   11/14/22 1107 11/14/22 1109  Resp: 15   SpO2:  97%  Weight: 184 lb (83.5 kg)   Height: 5\' 5"  (1.651 m)    Not recorded     Body mass index is 30.62 kg/m.  PHYSICAL EXAMNIATION:  Gen: NAD, conversant, well nourised, well groomed                     Cardiovascular: Regular rate rhythm, no peripheral edema, warm, nontender. Eyes: Conjunctivae clear without exudates or hemorrhage Neck: Supple, no carotid bruits. Pulmonary: Clear to auscultation bilaterally   NEUROLOGICAL EXAM:  MENTAL STATUS: Speech/cognition: Awake, alert,  oriented to history taking and casual conversation CRANIAL NERVES: CN II: Visual fields are full to confrontation. Pupils are round equal and briskly reactive to light. CN III, IV, VI: extraocular movement are normal. No ptosis. CN V: Facial sensation is intact to light touch CN VII: Face is symmetric with normal eye closure  CN VIII: Hearing is normal to causal conversation. CN IX, X: Phonation is normal. CN XI: Head turning and shoulder shrug are  intact  MOTOR: There is no pronator drift of out-stretched arms. Muscle bulk and tone are normal. Muscle strength is normal.  REFLEXES: Reflexes are 2+ and symmetric at the biceps, triceps, knees, and ankles. Plantar responses are flexor.  SENSORY: Intact to light touch, pinprick and vibratory sensation are intact in fingers and toes.  COORDINATION: There is no trunk or limb dysmetria noted.  GAIT/STANCE: Posture is normal. Gait is steady with normal steps, base, arm swing, and turning. Heel and toe walking are normal. Tandem gait is normal.  Romberg is absent.  REVIEW OF SYSTEMS:  Full 14 system review of systems performed and notable only for as above All other review of systems were negative.   ALLERGIES: No Known Allergies  HOME MEDICATIONS: Current Outpatient Medications  Medication Sig Dispense Refill   buPROPion (WELLBUTRIN XL) 150 MG 24 hr tablet Take 150 mg by mouth every morning.     CINNAMON PO Take 1 tablet by mouth daily.     GARLIC PO Take 1 tablet by mouth daily.     ibuprofen (ADVIL) 800 MG tablet Take 800 mg by mouth every 8 (eight) hours as needed.     Misc Natural Products (BLACK COHOSH MENOPAUSE COMPLEX) TABS 1 tablet     No current facility-administered medications for this visit.    PAST MEDICAL HISTORY: Past Medical History:  Diagnosis Date   Dysplastic nevus 06/07/2012   right breast (severe) excised   Dysplastic nevus 08/03/2015   left post flank   Dysplastic nevus 08/10/2016   right UQA    PAST SURGICAL HISTORY: Past Surgical History:  Procedure Laterality Date   BREAST BIOPSY Left 05/12/2020   BREAST BIOPSY Bilateral 02/2017    FAMILY HISTORY: Family History  Problem Relation Age of Onset   Breast cancer Maternal Aunt    Breast cancer Maternal Aunt    Breast cancer Paternal Aunt    Breast cancer Cousin    Breast cancer Cousin    Breast cancer Cousin     SOCIAL HISTORY: Social History   Socioeconomic History   Marital  status: Married    Spouse name: Not on file   Number of children: 2   Years of education: Not on file   Highest education level: 12th grade  Occupational History   Not on file  Tobacco Use   Smoking status: Never   Smokeless tobacco: Never  Vaping Use   Vaping status: Never Used  Substance and Sexual Activity   Alcohol use: Yes    Alcohol/week: 1.0 standard drink of alcohol    Types: 1 Standard drinks or equivalent per week   Drug use: Not Currently   Sexual activity: Yes    Birth control/protection: None  Other Topics Concern   Not on file  Social History Narrative   Not on file   Social Determinants of Health   Financial Resource Strain: Not on file  Food Insecurity: Not on file  Transportation Needs: Not on file  Physical Activity: Not on file  Stress: Not on file  Social  Connections: Not on file  Intimate Partner Violence: Not on file      Levert Feinstein, M.D. Ph.D.  [facility] [address], [address] [place], [place] [address] Ph: [phone] Fax: [phone]  CC:  Lupita Raider, MD [address] [place],  [place] [address]  Lupita Raider, MD

## 2022-11-17 ENCOUNTER — Telehealth: Payer: Self-pay | Admitting: Neurology

## 2022-11-17 DIAGNOSIS — G40209 Localization-related (focal) (partial) symptomatic epilepsy and epileptic syndromes with complex partial seizures, not intractable, without status epilepticus: Secondary | ICD-10-CM

## 2022-11-17 NOTE — Telephone Encounter (Signed)
Pt scheduled for 60 mins MR brain w/wo contrast at [facility] for 11/23/22 at 1:30pm. Pt is requesting medication to help with claustrophobia and was informed to have driver for appointment if medication is taken.  [employer] auth# [id number] (11/16/22-12/15/22)

## 2022-11-19 MED ORDER — ALPRAZOLAM 1 MG PO TABS: ORAL_TABLET | ORAL | 0 refills | Status: DC

## 2022-11-19 NOTE — Telephone Encounter (Signed)
Meds ordered this encounter  Medications   ALPRAZolam (XANAX) 1 MG tablet    Sig: Take 1-2 tablets 30 minutes prior to MRI, may repeat once as needed. Must have driver.    Dispense:  3 tablet    Refill:  0     

## 2022-11-19 NOTE — Addendum Note (Signed)
Addended by: Levert Feinstein on: 11/19/2022 02:41 PM   Modules accepted: Orders

## 2022-11-23 ENCOUNTER — Ambulatory Visit: Payer: BC Managed Care – PPO | Admitting: Dermatology

## 2022-11-23 ENCOUNTER — Ambulatory Visit: Payer: BC Managed Care – PPO

## 2022-11-23 DIAGNOSIS — Z86018 Personal history of other benign neoplasm: Secondary | ICD-10-CM

## 2022-11-23 DIAGNOSIS — L578 Other skin changes due to chronic exposure to nonionizing radiation: Secondary | ICD-10-CM

## 2022-11-23 DIAGNOSIS — D229 Melanocytic nevi, unspecified: Secondary | ICD-10-CM

## 2022-11-23 DIAGNOSIS — G40209 Localization-related (focal) (partial) symptomatic epilepsy and epileptic syndromes with complex partial seizures, not intractable, without status epilepticus: Secondary | ICD-10-CM | POA: Diagnosis not present

## 2022-11-23 DIAGNOSIS — L729 Follicular cyst of the skin and subcutaneous tissue, unspecified: Secondary | ICD-10-CM

## 2022-11-23 DIAGNOSIS — Z1283 Encounter for screening for malignant neoplasm of skin: Secondary | ICD-10-CM | POA: Diagnosis not present

## 2022-11-23 DIAGNOSIS — L818 Other specified disorders of pigmentation: Secondary | ICD-10-CM

## 2022-11-23 DIAGNOSIS — L820 Inflamed seborrheic keratosis: Secondary | ICD-10-CM

## 2022-11-23 DIAGNOSIS — L814 Other melanin hyperpigmentation: Secondary | ICD-10-CM

## 2022-11-23 MED ORDER — GADOBENATE DIMEGLUMINE 529 MG/ML IV SOLN
15.0000 mL | Freq: Once | INTRAVENOUS | Status: AC | PRN
  Administered 2022-11-23: 15 mL via INTRAVENOUS

## 2022-11-23 NOTE — Patient Instructions (Signed)

## 2022-11-23 NOTE — Progress Notes (Signed)
Follow-Up Visit   Subjective  Andrea Franklin is a 52 y.o. female who presents for the following: Skin Cancer Screening and Full Body Skin Exam  The patient presents for Total-Body Skin Exam (TBSE) for skin cancer screening and mole check. The patient has spots, moles and lesions to be evaluated, some may be new or changing and the patient may have concern these could be cancer.  The following portions of the chart were reviewed this encounter and updated as appropriate: medications, allergies, medical history  Review of Systems:  No other skin or systemic complaints except as noted in HPI or Assessment and Plan.  Objective  Well appearing patient in no apparent distress; mood and affect are within normal limits.  A full examination was performed including scalp, head, eyes, ears, nose, lips, neck, chest, axillae, abdomen, back, buttocks, bilateral upper extremities, bilateral lower extremities, hands, feet, fingers, toes, fingernails, and toenails. All findings within normal limits unless otherwise noted below.   Relevant physical exam findings are noted in the Assessment and Plan.  Bilateral lower legs Hypopigmented macules.  R lower leg x 1 Erythematous stuck-on, waxy papule or plaque    Assessment & Plan   SKIN CANCER SCREENING PERFORMED TODAY.  ACTINIC DAMAGE - Chronic condition, secondary to cumulative UV/sun exposure - diffuse scaly erythematous macules with underlying dyspigmentation - Recommend daily broad spectrum sunscreen SPF 30+ to sun-exposed areas, reapply every 2 hours as needed.  - Staying in the shade or wearing long sleeves, sun glasses (UVA+UVB protection) and wide brim hats (4-inch brim around the entire circumference of the hat) are also recommended for sun protection.  - Call for new or changing lesions.  LENTIGINES, SEBORRHEIC KERATOSES, HEMANGIOMAS - Benign normal skin lesions - Benign-appearing - Call for any changes  MELANOCYTIC NEVI -  Tan-brown and/or pink-flesh-colored symmetric macules and papules - Benign appearing on exam today - Observation - Call clinic for new or changing moles - Recommend daily use of broad spectrum spf 30+ sunscreen to sun-exposed areas.   HISTORY OF DYSPLASTIC NEVUS No evidence of recurrence today Recommend regular full body skin exams Recommend daily broad spectrum sunscreen SPF 30+ to sun-exposed areas, reapply every 2 hours as needed.  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  Observation.  Call clinic for new or changing lesions.  Recommend daily use of broad spectrum spf 30+ sunscreen to sun-exposed areas.   EPIDERMAL INCLUSION CYST Exam: Subcutaneous nodule at R mid back paraspinal 0.5 cm  Benign-appearing. Exam most consistent with an epidermal inclusion cyst. Discussed that a cyst is a benign growth that can grow over time and sometimes get irritated or inflamed. Recommend observation if it is not bothersome. Discussed option of surgical excision to remove it if it is growing, symptomatic, or other changes noted. Please call for new or changing lesions so they can be evaluated.  Idiopathic guttate hypomelanosis Bilateral lower legs  Benign.  Inflamed seborrheic keratosis R lower leg x 1  Symptomatic, irritating, patient would like treated.   Destruction of lesion - R lower leg x 1 Complexity: simple   Destruction method: cryotherapy   Informed consent: discussed and consent obtained   Timeout:  patient name, date of birth, surgical site, and procedure verified Lesion destroyed using liquid nitrogen: Yes   Region frozen until ice ball extended beyond lesion: Yes   Outcome: patient tolerated procedure well with no complications  Post-procedure details: wound care instructions given     Return in about 1 year (around 11/23/2023) for TBSE.  Maylene Roes, CMA, am  acting as scribe for Armida Sans, MD .   Documentation: I have reviewed the above documentation for accuracy and completeness, and I agree with the above.  Armida Sans, MD

## 2022-11-30 ENCOUNTER — Encounter: Payer: Self-pay | Admitting: Dermatology

## 2022-12-02 ENCOUNTER — Other Ambulatory Visit: Payer: Self-pay | Admitting: Neurology

## 2022-12-14 ENCOUNTER — Telehealth: Payer: Self-pay | Admitting: *Deleted

## 2022-12-14 ENCOUNTER — Telehealth: Payer: Self-pay

## 2022-12-14 DIAGNOSIS — Z0289 Encounter for other administrative examinations: Secondary | ICD-10-CM

## 2022-12-14 NOTE — Telephone Encounter (Signed)
Received disability form from [employer]. Spoke with patient, form fee paid. Form placed in POD 2 for completion. Paperwork does state this needs to be completed by 12/9

## 2022-12-14 NOTE — Telephone Encounter (Signed)
The [employer] form was email to the patient on 12/14/2022

## 2022-12-14 NOTE — Telephone Encounter (Signed)
Paperwork completed and placed in Pod 2 for signature and review by MD

## 2022-12-22 ENCOUNTER — Ambulatory Visit: Payer: BC Managed Care – PPO | Admitting: Neurology

## 2022-12-22 DIAGNOSIS — R55 Syncope and collapse: Secondary | ICD-10-CM | POA: Diagnosis not present

## 2022-12-22 DIAGNOSIS — G40209 Localization-related (focal) (partial) symptomatic epilepsy and epileptic syndromes with complex partial seizures, not intractable, without status epilepticus: Secondary | ICD-10-CM

## 2022-12-28 ENCOUNTER — Ambulatory Visit: Payer: BC Managed Care – PPO | Admitting: Neurology

## 2022-12-29 ENCOUNTER — Telehealth: Payer: Self-pay | Admitting: Neurology

## 2022-12-29 NOTE — Telephone Encounter (Signed)
Pt asking if EEG results from 12/12 have been received. Advised there was not any notes in the system showing that we received them yet. Also advised it could take up to 2 weeks and that someone would call her to discuss once received

## 2023-01-12 NOTE — Telephone Encounter (Signed)
 Pt called again wanting to know when she will be getting her results. Please advise.

## 2023-01-16 NOTE — Telephone Encounter (Signed)
 Call to patient, reviewed normal EEG results. Patient verbalized understanding. She reports that on 12/16/22, she felt like a spell was coming on but it never did, she reprots feeling dizzy and went to sit donw and drank cold water and the feeling passed. At that time she was not on the full dose of lamotrigine  yet. No spells or aura's since and reports medication compliance. She is in agreement to keep us  informed of any events and will order ambulatory EEG is episodes/auras become more frequent. She also reports that due to normal results, she will be trying to go back to work in person and verbalized understanding on  [place] driving laws. No other questions at this time

## 2023-01-16 NOTE — Telephone Encounter (Addendum)
 Please call patient EEG December 22, 2022 was normal  If she continue have recurrent spells taking lamotrigine, we will consider 72 hours ambulatory video EEG monitoring

## 2023-02-23 ENCOUNTER — Ambulatory Visit: Payer: BC Managed Care – PPO | Admitting: Neurology

## 2023-03-02 ENCOUNTER — Telehealth: Payer: Self-pay

## 2023-03-02 NOTE — Telephone Encounter (Signed)
[employer] excuse letter written per Dr. Terrace Arabia

## 2023-04-19 ENCOUNTER — Telehealth: Payer: Self-pay | Admitting: Neurology

## 2023-04-19 NOTE — Telephone Encounter (Signed)
 Returned pt's call regarding her message of when she would be able to drive again. Pt stated she had been seizure free since her last occurrence on Oct/18/2024. Pt stated she has been taking her medication regularly and has not missed any doses. Pt also questioned whether she needs to continue taking the Lamotrigine since she has been seizure free this long. Pt was advised to continue taking her medication every day as prescribed until she comes in for her follow up and discuss with Dr Terrace Arabia. Per [facility] statutes, patients with seizures are not allowed to drive until they have been seizure-free for six months. Pt was advised if she has been seizure free she could go back to driving after 1/61/0960. Pt was advised to let us know if any seizure like activity happens between now and her follow up appointment. Pt verbalized understanding and thanked me for calling.

## 2023-04-19 NOTE — Telephone Encounter (Signed)
 Pt called wanting to know if by 4/18 she will be able to legally drive. Please advise.

## 2023-05-01 ENCOUNTER — Telehealth: Payer: Self-pay | Admitting: Neurology

## 2023-05-01 NOTE — Telephone Encounter (Signed)
Patient called to verify appointment 

## 2023-05-15 ENCOUNTER — Encounter: Payer: Self-pay | Admitting: Neurology

## 2023-05-15 ENCOUNTER — Ambulatory Visit: Payer: BC Managed Care – PPO | Admitting: Neurology

## 2023-05-15 VITALS — BP 118/80 | HR 62 | Ht 65.0 in | Wt 184.5 lb

## 2023-05-15 DIAGNOSIS — R404 Transient alteration of awareness: Secondary | ICD-10-CM | POA: Insufficient documentation

## 2023-05-15 NOTE — Progress Notes (Signed)
 Chief Complaint  Patient presents with   Seizures    RM 12, ALONE, SZ: last sz 10/28/23, denied pt hadn't had sz before      ASSESSMENT AND PLAN  Andrea Franklin is a 53 y.o. female   Recurrent stereotypical episode,  Unsure etiology, differentiation diagnosis including possible partial seizure, versus syncope episode  MRI of the brain with without contrast was normal  EEG was normal  Discussed with patient, decided to taper off lamotrigine  over 4 weeks,  She will call office if she has recurrent spells  DIAGNOSTIC DATA (LABS, IMAGING, TESTING) - I reviewed patient records, labs, notes, testing and imaging myself where available.   MEDICAL HISTORY:  Andrea Franklin is a 53 year old female, seen in request by   her primary care doctor Glena Landau, for evaluation of recurrent passing out episode, initial evaluation November 14, 2022  History is obtained from the patient and review of electronic medical records. I personally reviewed pertinent available imaging films in PACS.   PMHx of   She reports 3 major episode of sudden onset passing out, often with preceding symptoms, other episode have similar semiology.  First episode was in 2019, she was on a business trip in [place], eating dinner, she suddenly felt hot flash, vertigo, when she tried to get up walking, she fell in the restaurant, had urinary incontinence, she did loss of consciousness transiently  Second episode was in 2021, she was eating dinner with her coworker, felt similar sensation coming on, hot flash, travel throughout her body, nausea, dizziness, vertigo, coworker described she has gaze to look in her eyes, become unresponsive, grabbed her in the chair preventing her fall, episode lasted for few minutes, also had urinary incontinence,  Most recent episode was on October 29, 2022, she was eating out with her husband, sitting in a restaurant, had similar sensation again, overwhelming hot flash, dizziness,  broke out in cold sweat, her husband helped her moving out of the restaurant, sitting at the outside bench, suddenly she threw her head backwards, become unresponsive, was taken by ambulance to emergency room, denies urinary incontinence tongue biting  Lab showed elevated wbc of 12.8,  normal BMP  She had 2 small episode over past few weeks, describing sudden onset similar hot flash, dizziness, fainting sensation, but without further evolving into loss of consciousness  Looking back, she had similar spells for many years, as far back as high school, thought it was due to her glucose issues, but never had a documented hypoglycemia,  Dr. Claudene Crystal from [place] in October 2024, normal vitamin D 48, LDL 174, CMP creatinine 0.86, CBC hemoglobin of 13.6,  UPDATE May 5th 2025: She retired since March 2025, much relaxed, feeling good, no recurrent episode, MRI of the brain with and without contrast was normal EEG November EEG was normal She tolerating lamotrigine  100 mg twice a day, but want to go off the medication,  PHYSICAL EXAM:   Vitals:   05/15/23 1109  BP: 118/80  Pulse: 62  Weight: 184 lb 8 oz (83.7 kg)  Height: 5\' 5"  (1.651 m)     Body mass index is 30.7 kg/m.  PHYSICAL EXAMNIATION:  Gen: NAD, conversant, well nourised, well groomed                     Cardiovascular: Regular rate rhythm, no peripheral edema, warm, nontender. Eyes: Conjunctivae clear without exudates or hemorrhage Neck: Supple, no carotid bruits. Pulmonary: Clear to auscultation bilaterally   NEUROLOGICAL  EXAM:  MENTAL STATUS: Speech/cognition: Awake, alert, oriented to history taking and casual conversation CRANIAL NERVES: CN II: Visual fields are full to confrontation. Pupils are round equal and briskly reactive to light. CN III, IV, VI: extraocular movement are normal. No ptosis. CN V: Facial sensation is intact to light touch CN VII: Face is symmetric with normal eye closure  CN VIII: Hearing is  normal to causal conversation. CN IX, X: Phonation is normal. CN XI: Head turning and shoulder shrug are intact  MOTOR: There is no pronator drift of out-stretched arms. Muscle bulk and tone are normal. Muscle strength is normal.  REFLEXES: Reflexes are 2+ and symmetric at the biceps, triceps, knees, and ankles. Plantar responses are flexor.  SENSORY: Intact to light touch,   COORDINATION: There is no trunk or limb dysmetria noted.  GAIT/STANCE: Posture is normal. Gait is steady    REVIEW OF SYSTEMS:  Full 14 system review of systems performed and notable only for as above All other review of systems were negative.   ALLERGIES: Allergies  Allergen Reactions   Bupropion     Other Reaction(s): ? seizures    HOME MEDICATIONS: Current Outpatient Medications  Medication Sig Dispense Refill   CINNAMON PO Take 1 tablet by mouth daily.     CRESTOR 5 MG tablet Take 5 mg by mouth daily.     ibuprofen (ADVIL) 800 MG tablet Take 800 mg by mouth every 8 (eight) hours as needed.     lamoTRIgine  (LAMICTAL ) 100 MG tablet TAKE 1 TABLET BY MOUTH TWICE A DAY 180 tablet 3   No current facility-administered medications for this visit.    PAST MEDICAL HISTORY: Past Medical History:  Diagnosis Date   Dysplastic nevus 06/07/2012   right breast (severe) excised   Dysplastic nevus 08/03/2015   left post flank   Dysplastic nevus 08/10/2016   right UQA    PAST SURGICAL HISTORY: Past Surgical History:  Procedure Laterality Date   BREAST BIOPSY Left 05/12/2020   BREAST BIOPSY Bilateral 02/2017    FAMILY HISTORY: Family History  Problem Relation Age of Onset   Breast cancer Maternal Aunt    Breast cancer Maternal Aunt    Breast cancer Paternal Aunt    Breast cancer Cousin    Breast cancer Cousin    Breast cancer Cousin     SOCIAL HISTORY: Social History   Socioeconomic History   Marital status: Married    Spouse name: Not on file   Number of children: 2   Years of  education: Not on file   Highest education level: 12th grade  Occupational History   Not on file  Tobacco Use   Smoking status: Never   Smokeless tobacco: Never  Vaping Use   Vaping status: Never Used  Substance and Sexual Activity   Alcohol use: Yes    Alcohol/week: 1.0 standard drink of alcohol    Types: 1 Standard drinks or equivalent per week   Drug use: Not Currently   Sexual activity: Yes    Birth control/protection: None  Other Topics Concern   Not on file  Social History Narrative   Not on file   Social Drivers of Health   Financial Resource Strain: Not on file  Food Insecurity: Not on file  Transportation Needs: Not on file  Physical Activity: Not on file  Stress: Not on file  Social Connections: Not on file  Intimate Partner Violence: Not on file      Phebe Brasil, M.D. Ph.D.  [facility] [address] [place], [place] [address] Ph: [phone] Fax: [phone]  CC:  Glena Landau, MD [address] [place],  [place] [address]  Glena Landau, MD

## 2023-06-23 ENCOUNTER — Other Ambulatory Visit (HOSPITAL_BASED_OUTPATIENT_CLINIC_OR_DEPARTMENT_OTHER): Payer: Self-pay | Admitting: Family Medicine

## 2023-06-23 DIAGNOSIS — E7800 Pure hypercholesterolemia, unspecified: Secondary | ICD-10-CM

## 2023-07-13 ENCOUNTER — Ambulatory Visit (HOSPITAL_BASED_OUTPATIENT_CLINIC_OR_DEPARTMENT_OTHER)
Admission: RE | Admit: 2023-07-13 | Discharge: 2023-07-13 | Disposition: A | Discharge status: Home / Self Care | Payer: Self-pay | Source: Ambulatory Visit | Attending: Family Medicine | Admitting: Family Medicine

## 2023-07-13 DIAGNOSIS — E7800 Pure hypercholesterolemia, unspecified: Secondary | ICD-10-CM | POA: Insufficient documentation

## 2023-07-18 ENCOUNTER — Other Ambulatory Visit: Payer: Self-pay | Admitting: Obstetrics and Gynecology

## 2023-07-18 DIAGNOSIS — E0789 Other specified disorders of thyroid: Secondary | ICD-10-CM

## 2023-07-19 ENCOUNTER — Other Ambulatory Visit

## 2023-07-27 ENCOUNTER — Ambulatory Visit
Admission: RE | Admit: 2023-07-27 | Discharge: 2023-07-27 | Disposition: A | Discharge status: Home / Self Care | Source: Ambulatory Visit | Attending: Obstetrics and Gynecology | Admitting: Obstetrics and Gynecology

## 2023-07-27 DIAGNOSIS — E0789 Other specified disorders of thyroid: Secondary | ICD-10-CM

## 2023-11-20 IMAGING — MG DIGITAL DIAGNOSTIC BILAT W/ TOMO W/ CAD
8 series · 8 of 24 positions shown · non-contrast
Comparison: Previous exam(s).

CLINICAL DATA: One year interval follow-up after a stereotactic
core needle biopsy of an asymmetry in the UPPER OUTER QUADRANT of
the LEFT breast with pathology stromal fibrosis. Annual evaluation,
RIGHT breast.

EXAM:
DIGITAL DIAGNOSTIC BILATERAL MAMMOGRAM WITH TOMOSYNTHESIS AND CAD
TECHNIQUE: Bilateral digital diagnostic mammography and breast tomosynthesis
was performed. The images were evaluated with computer-aided
detection.

[R CC synth-2D]
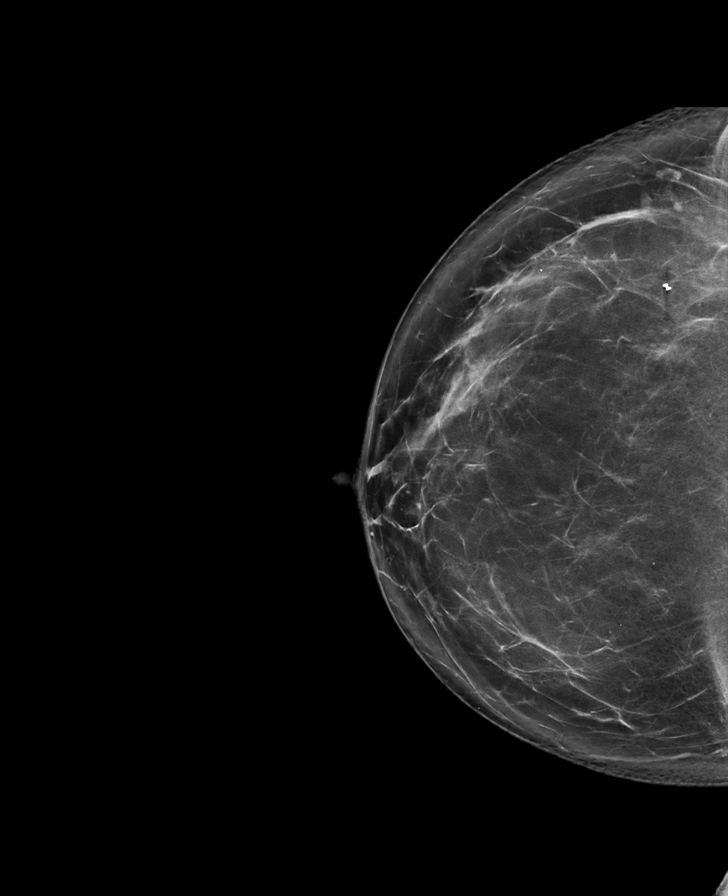

[L CC synth-2D]
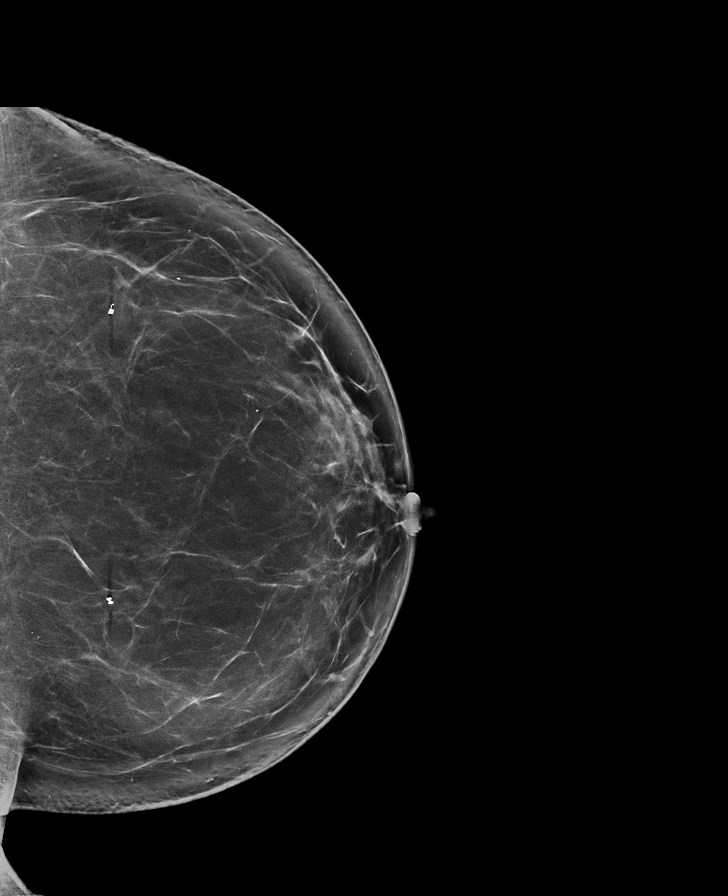

[L MLO synth-2D]
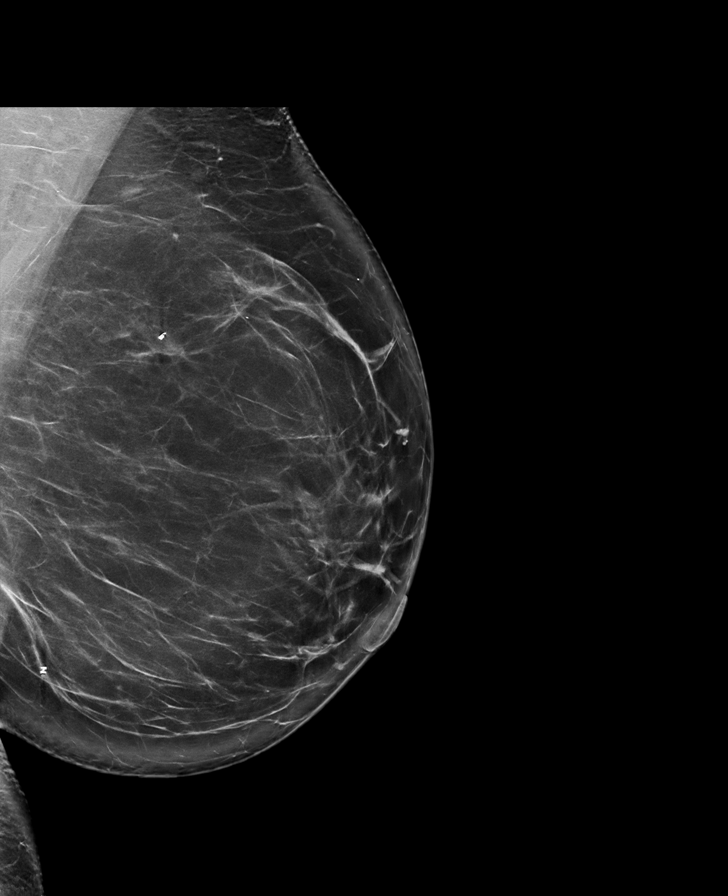

[R MLO synth-2D]
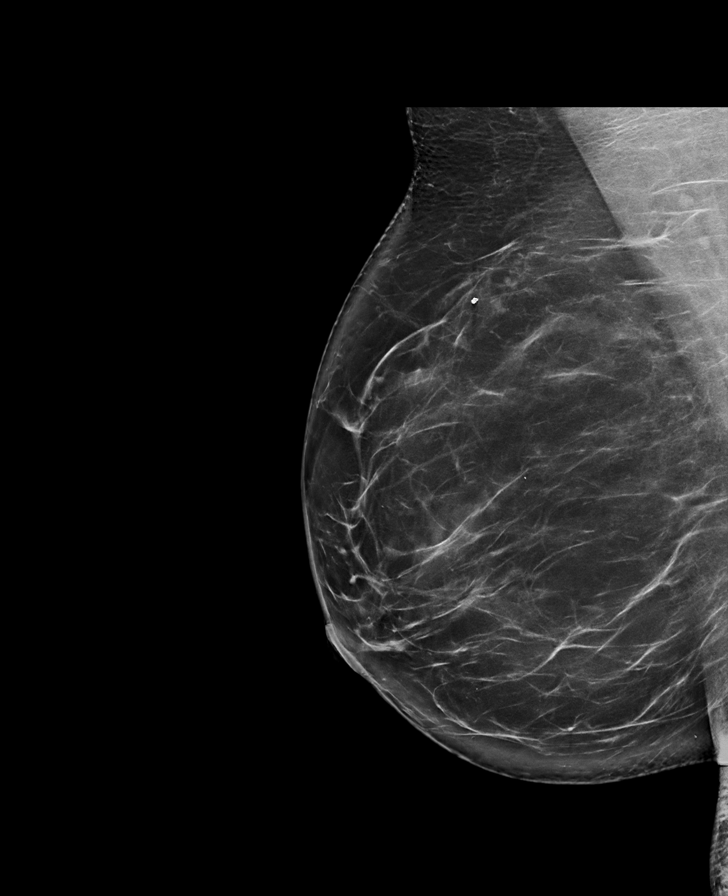

[L CC tomo · tomo slice 49/96.0]
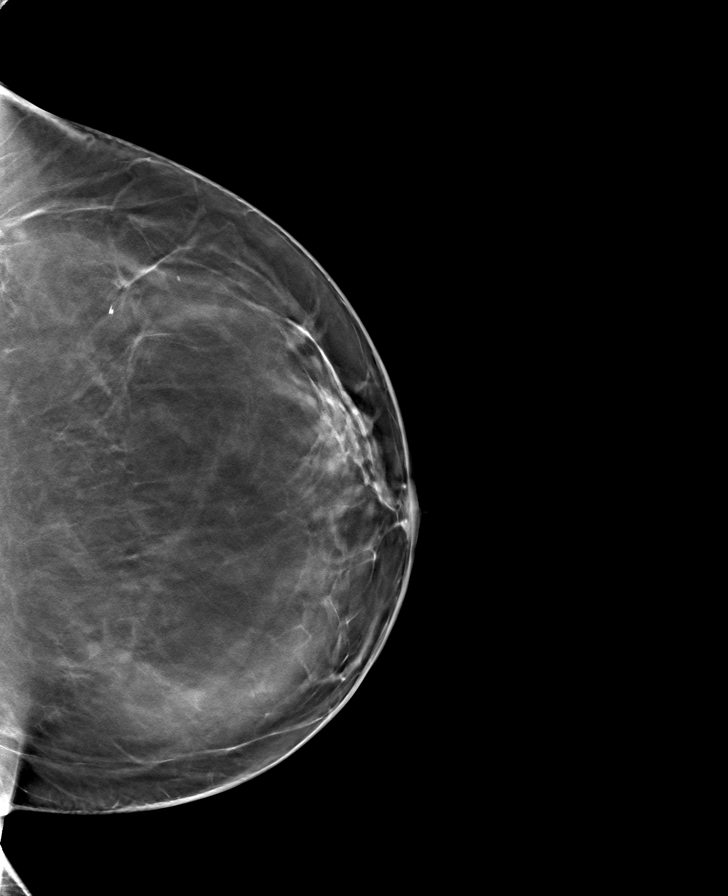

[R MLO tomo · tomo slice 49/98.0]
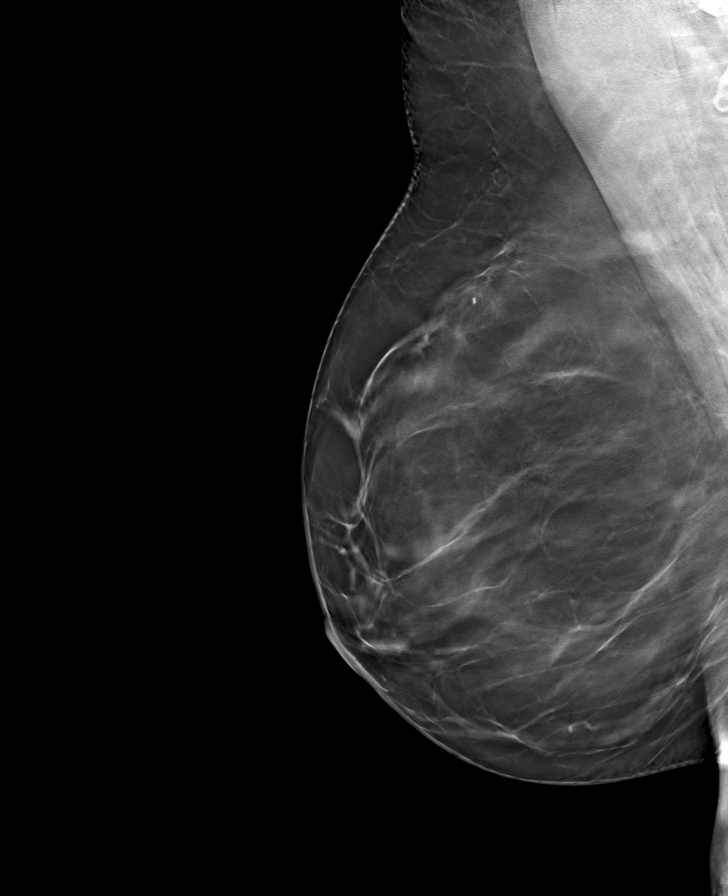

[L MLO tomo · tomo slice 50/99.0]
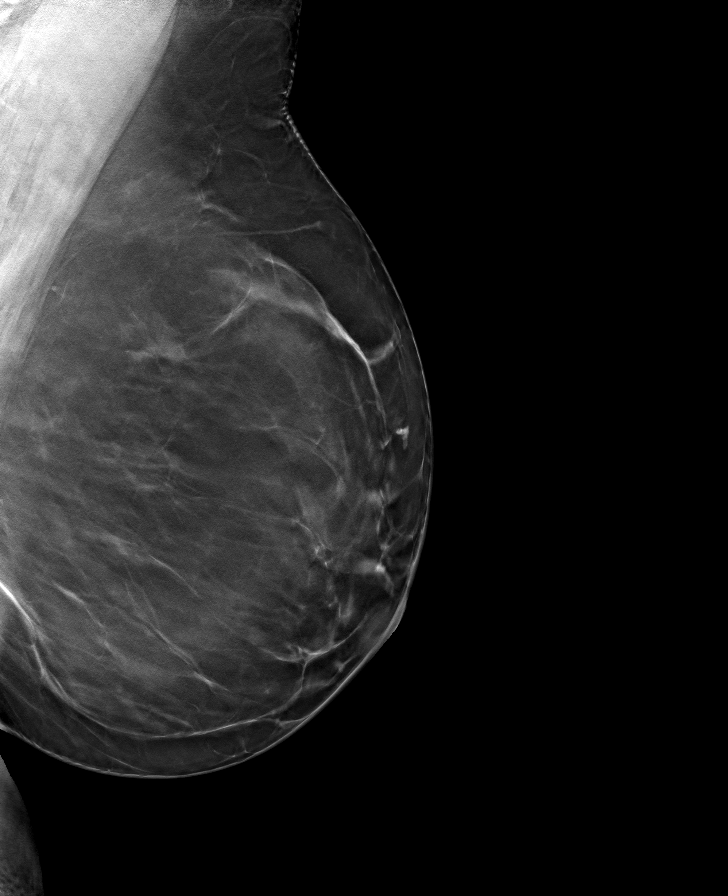

[R CC tomo · tomo slice 47/94.0]
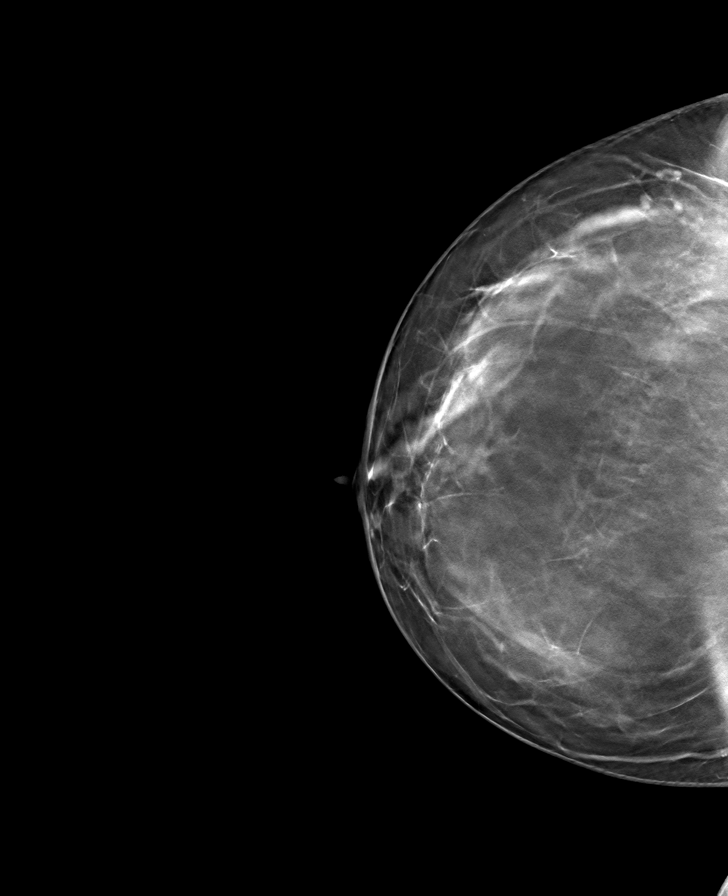

[8 of 24 positions shown; findings below may reference images not displayed]

ACR Breast Density Category b: There are scattered areas of
fibroglandular density.
FINDINGS: Full field CC and MLO views of both breasts were obtained.

RIGHT: No findings suspicious for malignancy.

LEFT: No findings suspicious for malignancy. The asymmetry in the
UPPER OUTER QUADRANT at posterior depth, biopsy-proven stromal
fibrosis, is less conspicuous than on the prior mammograms.
IMPRESSION: No mammographic evidence of malignancy involving either breast.

RECOMMENDATION:
Screening mammogram in one year.(Code:CI-S-SVV)

I have discussed the findings and recommendations with the patient.
If applicable, a reminder letter will be sent to the patient
regarding the next appointment.

BI-RADS CATEGORY  2: Benign.

## 2023-11-29 ENCOUNTER — Encounter: Payer: Self-pay | Admitting: Dermatology

## 2023-11-29 ENCOUNTER — Ambulatory Visit: Payer: BC Managed Care – PPO | Admitting: Dermatology

## 2023-11-29 DIAGNOSIS — D2262 Melanocytic nevi of left upper limb, including shoulder: Secondary | ICD-10-CM | POA: Diagnosis not present

## 2023-11-29 DIAGNOSIS — Z1283 Encounter for screening for malignant neoplasm of skin: Secondary | ICD-10-CM

## 2023-11-29 DIAGNOSIS — D229 Melanocytic nevi, unspecified: Secondary | ICD-10-CM

## 2023-11-29 DIAGNOSIS — L720 Epidermal cyst: Secondary | ICD-10-CM

## 2023-11-29 DIAGNOSIS — Z86018 Personal history of other benign neoplasm: Secondary | ICD-10-CM

## 2023-11-29 DIAGNOSIS — D492 Neoplasm of unspecified behavior of bone, soft tissue, and skin: Secondary | ICD-10-CM

## 2023-11-29 DIAGNOSIS — D1801 Hemangioma of skin and subcutaneous tissue: Secondary | ICD-10-CM

## 2023-11-29 DIAGNOSIS — L905 Scar conditions and fibrosis of skin: Secondary | ICD-10-CM

## 2023-11-29 DIAGNOSIS — L814 Other melanin hyperpigmentation: Secondary | ICD-10-CM

## 2023-11-29 DIAGNOSIS — W908XXA Exposure to other nonionizing radiation, initial encounter: Secondary | ICD-10-CM | POA: Diagnosis not present

## 2023-11-29 DIAGNOSIS — L729 Follicular cyst of the skin and subcutaneous tissue, unspecified: Secondary | ICD-10-CM

## 2023-11-29 DIAGNOSIS — L578 Other skin changes due to chronic exposure to nonionizing radiation: Secondary | ICD-10-CM

## 2023-11-29 DIAGNOSIS — L989 Disorder of the skin and subcutaneous tissue, unspecified: Secondary | ICD-10-CM

## 2023-11-29 DIAGNOSIS — L821 Other seborrheic keratosis: Secondary | ICD-10-CM

## 2023-11-29 NOTE — Progress Notes (Signed)
 Follow-Up Visit   Subjective  Andrea Franklin is a 53 y.o. female who presents for the following: Skin Cancer Screening and Full Body Skin Exam. Hx of dysplastic nevi.   The patient presents for Total-Body Skin Exam (TBSE) for skin cancer screening and mole check. The patient has spots, moles and lesions to be evaluated, some may be new or changing and the patient may have concern these could be cancer.    The following portions of the chart were reviewed this encounter and updated as appropriate: medications, allergies, medical history  Review of Systems:  No other skin or systemic complaints except as noted in HPI or Assessment and Plan.  Objective  Well appearing patient in no apparent distress; mood and affect are within normal limits.  A full examination was performed including scalp, head, eyes, ears, nose, lips, neck, chest, axillae, abdomen, back, buttocks, bilateral upper extremities, bilateral lower extremities, hands, feet, fingers, toes, fingernails, and toenails. All findings within normal limits unless otherwise noted below.   Relevant physical exam findings are noted in the Assessment and Plan.  Left Posterior Flank 0.2 cm light brown recurrence of pigment within scar  Left Shoulder - Anterior 0.4 cm irregular medium brown macule   Assessment & Plan   SKIN CANCER SCREENING PERFORMED TODAY.  HISTORY OF DYSPLASTIC NEVUS No evidence of recurrence today Recommend regular full body skin exams Recommend daily broad spectrum sunscreen SPF 30+ to sun-exposed areas, reapply every 2 hours as needed.  Call if any new or changing lesions are noted between office visits  ACTINIC DAMAGE - Chronic condition, secondary to cumulative UV/sun exposure - diffuse scaly erythematous macules with underlying dyspigmentation - Recommend daily broad spectrum sunscreen SPF 30+ to sun-exposed areas, reapply every 2 hours as needed.  - Staying in the shade or wearing long sleeves,  sun glasses (UVA+UVB protection) and wide brim hats (4-inch brim around the entire circumference of the hat) are also recommended for sun protection.  - Call for new or changing lesions.  LENTIGINES, SEBORRHEIC KERATOSES, HEMANGIOMAS - Benign normal skin lesions - Benign-appearing - Call for any changes  MELANOCYTIC NEVI - Tan-brown and/or pink-flesh-colored symmetric macules and papules - Benign appearing on exam today - Observation - Call clinic for new or changing moles - Recommend daily use of broad spectrum spf 30+ sunscreen to sun-exposed areas.    Linear epidermal nevus vs linear congenital nevus - R deltoid, Benign-appearing.  Observation.  Call clinic for new or changing lesions.  Recommend daily use of broad spectrum spf 30+ sunscreen to sun-exposed areas.     EPIDERMAL INCLUSION CYST Exam: Subcutaneous nodule at R mid back paraspinal 0.5 cm   Benign-appearing. Exam most consistent with an epidermal inclusion cyst. Discussed that a cyst is a benign growth that can grow over time and sometimes get irritated or inflamed. Recommend observation if it is not bothersome. Discussed option of surgical excision to remove it if it is growing, symptomatic, or other changes noted. Please call for new or changing lesions so they can be evaluated.   NEOPLASM OF SKIN (2) Left Posterior Flank Epidermal / dermal shaving  Lesion diameter (cm):  0.2 Informed consent: discussed and consent obtained   Timeout: patient name, date of birth, surgical site, and procedure verified   Procedure prep:  Patient was prepped and draped in usual sterile fashion Prep type:  Isopropyl alcohol Anesthesia: the lesion was anesthetized in a standard fashion   Anesthetic:  1% lidocaine w/ epinephrine 1-100,000 buffered w/  8.4% NaHCO3 Instrument used: flexible razor blade   Hemostasis achieved with: pressure, aluminum chloride and electrodesiccation   Outcome: patient tolerated procedure well    Post-procedure details: sterile dressing applied and wound care instructions given   Dressing type: bandage and petrolatum    Specimen 1 - Surgical pathology Differential Diagnosis: R/O recurrent dysplastic nevus with mild atypia.   Check Margins: Yes  Previous pathology: [id number] Left Shoulder - Anterior Epidermal / dermal shaving  Lesion diameter (cm):  0.4 Informed consent: discussed and consent obtained   Timeout: patient name, date of birth, surgical site, and procedure verified   Procedure prep:  Patient was prepped and draped in usual sterile fashion Prep type:  Isopropyl alcohol Anesthesia: the lesion was anesthetized in a standard fashion   Anesthetic:  1% lidocaine w/ epinephrine 1-100,000 buffered w/ 8.4% NaHCO3 Instrument used: flexible razor blade   Hemostasis achieved with: pressure, aluminum chloride and electrodesiccation   Outcome: patient tolerated procedure well   Post-procedure details: sterile dressing applied and wound care instructions given   Dressing type: bandage and petrolatum    Specimen 2 - Surgical pathology Differential Diagnosis: R/O dysplastic nevus  Check Margins: Yes  Return in about 1 year (around 11/28/2024) for TBSE, HxDN.  I, Zakry Caso, CMA, am acting as scribe for Alm Rhyme, MD.   Documentation: I have reviewed the above documentation for accuracy and completeness, and I agree with the above.  Alm Rhyme, MD

## 2023-11-29 NOTE — Patient Instructions (Addendum)

## 2023-12-01 LAB — SURGICAL PATHOLOGY

## 2023-12-02 ENCOUNTER — Ambulatory Visit: Payer: Self-pay | Admitting: Dermatology

## 2023-12-03 ENCOUNTER — Encounter: Payer: Self-pay | Admitting: Dermatology

## 2023-12-04 ENCOUNTER — Encounter: Payer: Self-pay | Admitting: Dermatology

## 2023-12-04 NOTE — Telephone Encounter (Addendum)
 Called and discussed results with patient. She verbalized understanding and denied further questions. Will recheck at next followup  ----- Message from Alm Rhyme sent at 12/02/2023  8:09 PM EST ----- FINAL DIAGNOSIS        1. Skin, left posterior flank :       LENTIGO OVERLYING DERMAL SCAR        2. Skin, left shoulder - anterior :       DYSPLASTIC COMPOUND NEVUS WITH MODERATE ATYPIA, LIMITED MARGINS FREE    1- Site of previous mild dysplastic Now showing freckle overlying scar Recheck next visit 2- Moderate dysplastic Recheck next visit ----- Message ----- From: Interface, Lab In Three Zero One Sent: 12/01/2023   4:24 PM EST To: Alm JAYSON Rhyme, MD

## 2024-02-06 ENCOUNTER — Ambulatory Visit

## 2024-12-04 ENCOUNTER — Ambulatory Visit: Admitting: Dermatology
